# Patient Record
Sex: Female | Born: 1960 | Race: White | Hispanic: No | Marital: Married | Smoking: Former smoker
Health system: Southern US, Community
[De-identification: ages and names within clinical notes are randomized; demographics above are authoritative.]

## PROBLEM LIST (undated history)

## (undated) DIAGNOSIS — G5603 Carpal tunnel syndrome, bilateral upper limbs: Secondary | ICD-10-CM

## (undated) DIAGNOSIS — J309 Allergic rhinitis, unspecified: Secondary | ICD-10-CM

## (undated) DIAGNOSIS — M7541 Impingement syndrome of right shoulder: Secondary | ICD-10-CM

## (undated) DIAGNOSIS — I10 Essential (primary) hypertension: Secondary | ICD-10-CM

## (undated) DIAGNOSIS — L4050 Arthropathic psoriasis, unspecified: Secondary | ICD-10-CM

## (undated) DIAGNOSIS — J329 Chronic sinusitis, unspecified: Secondary | ICD-10-CM

## (undated) DIAGNOSIS — K1120 Sialoadenitis, unspecified: Secondary | ICD-10-CM

## (undated) DIAGNOSIS — E785 Hyperlipidemia, unspecified: Secondary | ICD-10-CM

## (undated) DIAGNOSIS — M4722 Other spondylosis with radiculopathy, cervical region: Secondary | ICD-10-CM

## (undated) DIAGNOSIS — Z8739 Personal history of other diseases of the musculoskeletal system and connective tissue: Secondary | ICD-10-CM

## (undated) DIAGNOSIS — K9041 Non-celiac gluten sensitivity: Secondary | ICD-10-CM

## (undated) DIAGNOSIS — M5412 Radiculopathy, cervical region: Secondary | ICD-10-CM

## (undated) HISTORY — DX: Radiculopathy, cervical region: M54.12

## (undated) HISTORY — DX: Chronic sinusitis, unspecified: J32.9

## (undated) HISTORY — DX: Impingement syndrome of right shoulder: M75.41

## (undated) HISTORY — DX: Allergic rhinitis, unspecified: J30.9

## (undated) HISTORY — DX: Essential (primary) hypertension: I10

## (undated) HISTORY — PX: ABDOMINAL HYSTERECTOMY: SHX81

## (undated) HISTORY — DX: Sialoadenitis, unspecified: K11.20

---
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
## 1995-02-06 HISTORY — PX: JOINT REPLACEMENT: SHX530

## 1995-02-06 HISTORY — PX: TOTAL HIP ARTHROPLASTY: SHX124

## 2014-05-08 ENCOUNTER — Emergency Department (HOSPITAL_COMMUNITY)
Admission: EM | Admit: 2014-05-08 | Discharge: 2014-05-08 | Disposition: A | Discharge status: Home / Self Care | Payer: No Typology Code available for payment source | Source: Home / Self Care | Attending: Family Medicine | Admitting: Family Medicine

## 2014-05-08 ENCOUNTER — Emergency Department (HOSPITAL_COMMUNITY)
Admission: EM | Admit: 2014-05-08 | Discharge: 2014-05-08 | Disposition: A | Discharge status: Home / Self Care | Payer: No Typology Code available for payment source | Attending: Emergency Medicine | Admitting: Emergency Medicine

## 2014-05-08 ENCOUNTER — Encounter (HOSPITAL_COMMUNITY): Payer: Self-pay | Admitting: Emergency Medicine

## 2014-05-08 ENCOUNTER — Encounter (HOSPITAL_COMMUNITY): Payer: Self-pay | Admitting: *Deleted

## 2014-05-08 DIAGNOSIS — L259 Unspecified contact dermatitis, unspecified cause: Secondary | ICD-10-CM | POA: Diagnosis not present

## 2014-05-08 DIAGNOSIS — H6012 Cellulitis of left external ear: Secondary | ICD-10-CM | POA: Insufficient documentation

## 2014-05-08 DIAGNOSIS — T7840XD Allergy, unspecified, subsequent encounter: Secondary | ICD-10-CM

## 2014-05-08 DIAGNOSIS — L239 Allergic contact dermatitis, unspecified cause: Secondary | ICD-10-CM

## 2014-05-08 DIAGNOSIS — L200 Besnier's prurigo: Secondary | ICD-10-CM

## 2014-05-08 DIAGNOSIS — R000 Tachycardia, unspecified: Secondary | ICD-10-CM | POA: Diagnosis not present

## 2014-05-08 DIAGNOSIS — R220 Localized swelling, mass and lump, head: Secondary | ICD-10-CM | POA: Diagnosis present

## 2014-05-08 LAB — CBC WITH DIFFERENTIAL/PLATELET
BASOS PCT: 0 % (ref 0–1)
Basophils Absolute: 0 10*3/uL (ref 0.0–0.1)
EOS ABS: 0.1 10*3/uL (ref 0.0–0.7)
Eosinophils Relative: 1 % (ref 0–5)
HCT: 38 % (ref 36.0–46.0)
Hemoglobin: 12.7 g/dL (ref 12.0–15.0)
LYMPHS ABS: 0.9 10*3/uL (ref 0.7–4.0)
Lymphocytes Relative: 12 % (ref 12–46)
MCH: 31.4 pg (ref 26.0–34.0)
MCHC: 33.4 g/dL (ref 30.0–36.0)
MCV: 94.1 fL (ref 78.0–100.0)
Monocytes Absolute: 1 10*3/uL (ref 0.1–1.0)
Monocytes Relative: 13 % — ABNORMAL HIGH (ref 3–12)
NEUTROS PCT: 74 % (ref 43–77)
Neutro Abs: 5.6 10*3/uL (ref 1.7–7.7)
Platelets: 187 10*3/uL (ref 150–400)
RBC: 4.04 MIL/uL (ref 3.87–5.11)
RDW: 13.2 % (ref 11.5–15.5)
WBC: 7.7 10*3/uL (ref 4.0–10.5)

## 2014-05-08 LAB — BASIC METABOLIC PANEL
Anion gap: 9 (ref 5–15)
BUN: 9 mg/dL (ref 6–23)
CO2: 26 mmol/L (ref 19–32)
CREATININE: 0.83 mg/dL (ref 0.50–1.10)
Calcium: 8.9 mg/dL (ref 8.4–10.5)
Chloride: 99 mmol/L (ref 96–112)
GFR, EST NON AFRICAN AMERICAN: 79 mL/min — AB (ref >90)
Glucose, Bld: 96 mg/dL (ref 70–99)
POTASSIUM: 4 mmol/L (ref 3.5–5.1)
Sodium: 134 mmol/L — ABNORMAL LOW (ref 135–145)

## 2014-05-08 MED ORDER — CLINDAMYCIN HCL 150 MG PO CAPS: 450.0000 mg | ORAL_CAPSULE | Freq: Three times a day (TID) | ORAL | Status: DC

## 2014-05-08 MED ORDER — SODIUM CHLORIDE 0.9 % IV BOLUS (SEPSIS)
1000.0000 mL | Freq: Once | INTRAVENOUS | Status: AC
  Administered 2014-05-08: 1000 mL via INTRAVENOUS

## 2014-05-08 MED ORDER — CEPHALEXIN 500 MG PO CAPS: 500.0000 mg | ORAL_CAPSULE | Freq: Four times a day (QID) | ORAL | Status: DC

## 2014-05-08 MED ORDER — CLINDAMYCIN PHOSPHATE 600 MG/50ML IV SOLN
600.0000 mg | Freq: Once | INTRAVENOUS | Status: AC
  Administered 2014-05-08: 600 mg via INTRAVENOUS
  Filled 2014-05-08: qty 50

## 2014-05-08 MED ORDER — TRIAMCINOLONE ACETONIDE 40 MG/ML IJ SUSP
40.0000 mg | Freq: Once | INTRAMUSCULAR | Status: AC
  Administered 2014-05-08: 40 mg via INTRAMUSCULAR

## 2014-05-08 MED ORDER — TRIAMCINOLONE ACETONIDE 40 MG/ML IJ SUSP
INTRAMUSCULAR | Status: AC
  Filled 2014-05-08: qty 1

## 2014-05-08 MED ORDER — PREDNISONE 20 MG PO TABS: ORAL_TABLET | ORAL | Status: DC

## 2014-05-08 NOTE — ED Provider Notes (Signed)
CSN: [id number]     Arrival date & time 05/08/14  0901 History   First MD Initiated Contact with Patient 05/08/14 [phone]     Chief Complaint  Patient presents with  . Allergic Reaction   (Consider location/radiation/quality/duration/timing/severity/associated sxs/prior Treatment) HPI Comments: 54 year old female complaining of redness and swelling of the face tickly along the hairline in the ears. She had painful lymphadenopathy around the neck and a low-grade fever since she went to [facility] and had strep and mono testing. Both of those were negative.  She continues to have erythema around the fore head, ears as well as a prominent tender right occipital lymph node. She denies problems breathing or swelling but states her head feels really full and heavy.   No past medical history on file. No past surgical history on file. No family history on file. History  Substance Use Topics  . Smoking status: Not on file  . Smokeless tobacco: Not on file  . Alcohol Use: Not on file   OB History    No data available     Review of Systems  Constitutional: Positive for fever and activity change. Negative for fatigue.  HENT: Positive for sore throat. Negative for congestion.   Eyes: Negative.  Eye discharge: mild puffiness particularly on the right. Mild left facial swelling.  Respiratory: Positive for cough. Negative for shortness of breath, wheezing and stridor.   Cardiovascular: Negative for chest pain.  Gastrointestinal: Negative.   Neurological: Negative.   Psychiatric/Behavioral: Negative.     Allergies  Hydromorphone; Morphine and related; Pollen extract; and Pork-derived products  Home Medications   Prior to Admission medications   Medication Sig Start Date End Date Taking? Authorizing Provider  cephALEXin (KEFLEX) 500 MG capsule Take 1 capsule (500 mg total) by mouth 4 (four) times daily. 05/08/14   Hayden Rasmussen, NP  predniSONE (DELTASONE) 20 MG tablet Take 3 tabs po on first day, 2  tabs second day, 2 tabs third day, 1 tab fourth day, 1 tab 5th day. Take with food. 05/08/14   Hayden Rasmussen, NP   BP 130/95 mmHg  Pulse 111  Temp(Src) 99.5 F (37.5 C) (Oral)  Resp 18  SpO2 99% Physical Exam  Constitutional: She is oriented to person, place, and time. She appears well-developed and well-nourished. No distress.  HENT:  Bilateral TMs are normal. External ears with mild erythema and swelling and tenderness. Oropharynx with minor erythema, light frothy PND and cobblestoning.  Neck: Normal range of motion. Neck supple.  Cardiovascular: Regular rhythm and normal heart sounds.   Pulmonary/Chest: Effort normal and breath sounds normal. No respiratory distress. She has no wheezes. She has no rales.  Musculoskeletal:  No peripheral edema involving the extremities.  Lymphadenopathy:    She has cervical adenopathy.  Neurological: She is alert and oriented to person, place, and time.  Skin: Skin is warm and dry.  Nursing note and vitals reviewed.   ED Course  Procedures (including critical care time) Labs Review Labs Reviewed - No data to display  Imaging Review No results found.   MDM   1. Allergic reaction, subsequent encounter   2. Allergic dermatitis    Although I believe that most of this reaction is allergic all treat with a course of Keflex due to her fever and lymphadenopathy. Doubt the erythema is cellulitis but will treat give benefit of the doubt. Kenalog 40 mg IM now Prednisone taper dose Take in addition to your Singulair either Allegra or Zyrtec Worsening new symptoms or  problems may return.    Hayden Rasmussenavid Tanaya Dunigan, NP 05/08/14 [phone]

## 2014-05-08 NOTE — ED Notes (Signed)
Pt states that she believes she has had a allergic reaction to pollen her face is swollen and red pt states that it doesn't itch but does hurt and sensitive to touch.

## 2014-05-08 NOTE — ED Notes (Signed)
Recent hair perm

## 2014-05-08 NOTE — ED Notes (Signed)
Since Monday she has has some nodules from her neck up.  [facility] has been [facility]   This am and was given a shot and other po meds.  She has red splotches from her neck face and scalp.  Both eyes swollen today.  Last Friday she was at the [facility] with all their  Flowers  And her symptoms started on moinday

## 2014-05-08 NOTE — ED Notes (Signed)
She had a [facility] shot shot [facility] this am

## 2014-05-08 NOTE — ED Provider Notes (Signed)
CSN: [id number]     Arrival date & time 05/08/14  1837 History   First MD Initiated Contact with Patient 05/08/14 2029     Chief Complaint  Patient presents with  . Allergic Reaction     Patient is a 54 y.o. female presenting with allergic reaction. The history is provided by the patient. No language interpreter was used.  Allergic Reaction  Ms. Wendy Davies presents for evaluation of facial swelling.  She had a perm 10 days ago.  Seven days ago she developed a slight headache.  By five days ago she had swollen, tender lymph nodes on the back of her neck and posterior scalp.  She had associated malaise.  She is having low grade temperatures (99.8).  [facility] and was tested mono and strep and those were negative.  She developed increased redness around her hairline yesterday with splotches on her face. Today she developed swelling to her left ear and around her eyes.  She was seen at urgent care today and received a steroid shot and was started on prednisone and keflex for allergy and possible skin infection.     History reviewed. No pertinent past medical history. History reviewed. No pertinent past surgical history. No family history on file. History  Substance Use Topics  . Smoking status: Never Smoker   . Smokeless tobacco: Not on file  . Alcohol Use: Yes   OB History    No data available     Review of Systems  All other systems reviewed and are negative.     Allergies  Hydromorphone; Morphine and related; Pollen extract; and Pork-derived products  Home Medications   Prior to Admission medications   Medication Sig Start Date End Date Taking? Authorizing Provider  cephALEXin (KEFLEX) 500 MG capsule Take 1 capsule (500 mg total) by mouth 4 (four) times daily. 05/08/14   Hayden Rasmussenavid Mabe, NP  montelukast (SINGULAIR) 10 MG tablet Take 10 mg by mouth every evening. 04/03/14   Historical Provider, MD  predniSONE (DELTASONE) 20 MG tablet Take 3 tabs po on first day,  2 tabs second day, 2 tabs third day, 1 tab fourth day, 1 tab 5th day. Take with food. 05/08/14   Hayden Rasmussenavid Mabe, NP   BP 122/91 mmHg  Pulse 101  Temp(Src) 98.5 F (36.9 C) (Oral)  Resp 18  SpO2 98% Physical Exam  Constitutional: She is oriented to person, place, and time. She appears well-developed and well-nourished.  HENT:  Head: Normocephalic and atraumatic.  Mouth/Throat: Oropharynx is clear and moist.  Moderate swelling to left helix and pinna with tender preauricular lymphadenopathy.  Patchy erythema and macules to forehead.  Mild periorbital edema bilaterally.    Eyes: Pupils are equal, round, and reactive to light.  Cardiovascular: Regular rhythm.   No murmur heard. tachycardic  Pulmonary/Chest: Effort normal and breath sounds normal. No respiratory distress.  Abdominal: Soft. There is no tenderness. There is no rebound and no guarding.  Musculoskeletal: She exhibits no edema or tenderness.  Lymphadenopathy:    She has cervical adenopathy.  Neurological: She is alert and oriented to person, place, and time. No cranial nerve deficit.  Skin: Skin is warm and dry.  Psychiatric: She has a normal mood and affect. Her behavior is normal.  Nursing note and vitals reviewed.   ED Course  Procedures (including critical care time) Labs Review Labs Reviewed  BASIC METABOLIC PANEL - Abnormal; Notable for the following:    Sodium 134 (*)  GFR calc non Af Amer 79 (*)    All other components within normal limits  CBC WITH DIFFERENTIAL/PLATELET - Abnormal; Notable for the following:    Monocytes Relative 13 (*)    All other components within normal limits    Imaging Review No results found.   EKG Interpretation None      MDM   Final diagnoses:  Cellulitis of ear, left  Contact dermatitis    Patient here for progressive facial swelling and discomfort. Patient is nontoxic on exam and well-hydrated. Exam is consistent with cellulitis of the left ear with some preauricular and  cervical lymphadenopathy. There is no evidence of acute otitis media or mastoiditis. Clinical picture is not consistent with acute chrondritis. Discussed with patient's home care for contact dermatitis as well as cellulitis of the ear. Discussed with patient continuing her prednisone as prescribed at urgent care and changing to clindamycin. Discussed the importance of recheck examination as well as return precautions.    Tilden Fossa, MD 05/08/14 2340

## 2014-05-08 NOTE — Discharge Instructions (Signed)
Cellulitis °Cellulitis is an infection of the skin and the tissue beneath it. The infected area is usually red and tender. Cellulitis occurs most often in the arms and lower legs.  °CAUSES  °Cellulitis is caused by bacteria that enter the skin through cracks or cuts in the skin. The most common types of bacteria that cause cellulitis are staphylococci and streptococci. °SIGNS AND SYMPTOMS  °· Redness and warmth. °· Swelling. °· Tenderness or pain. °· Fever. °DIAGNOSIS  °Your health care provider can usually determine what is wrong based on a physical exam. Blood tests may also be done. °TREATMENT  °Treatment usually involves taking an antibiotic medicine. °HOME CARE INSTRUCTIONS  °· Take your antibiotic medicine as directed by your health care provider. Finish the antibiotic even if you start to feel better. °· Keep the infected arm or leg elevated to reduce swelling. °· Apply a warm cloth to the affected area up to 4 times per day to relieve pain. °· Take medicines only as directed by your health care provider. °· Keep all follow-up visits as directed by your health care provider. °SEEK MEDICAL CARE IF:  °· You notice red streaks coming from the infected area. °· Your red area gets larger or turns dark in color. °· Your bone or joint underneath the infected area becomes painful after the skin has healed. °· Your infection returns in the same area or another area. °· You notice a swollen bump in the infected area. °· You develop new symptoms. °· You have a fever. °SEEK IMMEDIATE MEDICAL CARE IF:  °· You feel very sleepy. °· You develop vomiting or diarrhea. °· You have a general ill feeling (malaise) with muscle aches and pains. °MAKE SURE YOU:  °· Understand these instructions. °· Will watch your condition. °· Will get help right away if you are not doing well or get worse. °Document Released: 11/01/2004 Document Revised: 06/08/2013 Document Reviewed: 04/09/2011 °ExitCare® Patient Information ©2015 [employer].  This information is not intended to replace advice given to you by your health care provider. Make sure you discuss any questions you have with your health care provider. ° °Contact Dermatitis °Contact dermatitis is a reaction to certain substances that touch the skin. Contact dermatitis can be either irritant contact dermatitis or allergic contact dermatitis. Irritant contact dermatitis does not require previous exposure to the substance for a reaction to occur. Allergic contact dermatitis only occurs if you have been exposed to the substance before. Upon a repeat exposure, your body reacts to the substance.  °CAUSES  °Many substances can cause contact dermatitis. Irritant dermatitis is most commonly caused by repeated exposure to mildly irritating substances, such as: °· Makeup. °· Soaps. °· Detergents. °· Bleaches. °· Acids. °· Metal salts, such as nickel. °Allergic contact dermatitis is most commonly caused by exposure to: °· Poisonous plants. °· Chemicals (deodorants, shampoos). °· Jewelry. °· Latex. °· Neomycin in triple antibiotic cream. °· Preservatives in products, including clothing. °SYMPTOMS  °The area of skin that is exposed may develop: °· Dryness or flaking. °· Redness. °· Cracks. °· Itching. °· Pain or a burning sensation. °· Blisters. °With allergic contact dermatitis, there may also be swelling in areas such as the eyelids, mouth, or genitals.  °DIAGNOSIS  °Your caregiver can usually tell what the problem is by doing a physical exam. In cases where the cause is uncertain and an allergic contact dermatitis is suspected, a patch skin test may be performed to help determine the cause of your dermatitis. °TREATMENT °Treatment includes   protecting the skin from further contact with the irritating substance by avoiding that substance if possible. Barrier creams, powders, and gloves may be helpful. Your caregiver may also recommend: °· Steroid creams or ointments applied 2 times daily. For best results,  soak the rash area in cool water for 20 minutes. Then apply the medicine. Cover the area with a plastic wrap. You can store the steroid cream in the refrigerator for a "chilly" effect on your rash. That may decrease itching. Oral steroid medicines may be needed in more severe cases. °· Antibiotics or antibacterial ointments if a skin infection is present. °· Antihistamine lotion or an antihistamine taken by mouth to ease itching. °· Lubricants to keep moisture in your skin. °· Burow's solution to reduce redness and soreness or to dry a weeping rash. Mix one packet or tablet of solution in 2 cups cool water. Dip a clean washcloth in the mixture, wring it out a bit, and put it on the affected area. Leave the cloth in place for 30 minutes. Do this as often as possible throughout the day. °· Taking several cornstarch or baking soda baths daily if the area is too large to cover with a washcloth. °Harsh chemicals, such as alkalis or acids, can cause skin damage that is like a burn. You should flush your skin for 15 to 20 minutes with cold water after such an exposure. You should also seek immediate medical care after exposure. Bandages (dressings), antibiotics, and pain medicine may be needed for severely irritated skin.  °HOME CARE INSTRUCTIONS °· Avoid the substance that caused your reaction. °· Keep the area of skin that is affected away from hot water, soap, sunlight, chemicals, acidic substances, or anything else that would irritate your skin. °· Do not scratch the rash. Scratching may cause the rash to become infected. °· You may take cool baths to help stop the itching. °· Only take over-the-counter or prescription medicines as directed by your caregiver. °· See your caregiver for follow-up care as directed to make sure your skin is healing properly. °SEEK MEDICAL CARE IF:  °· Your condition is not better after 3 days of treatment. °· You seem to be getting worse. °· You see signs of infection such as swelling,  tenderness, redness, soreness, or warmth in the affected area. °· You have any problems related to your medicines. °Document Released: 01/20/2000 Document Revised: 04/16/2011 Document Reviewed: 06/27/2010 °ExitCare® Patient Information ©2015 [employer]. This information is not intended to replace advice given to you by your health care provider. Make sure you discuss any questions you have with your health care provider. ° °

## 2014-05-08 NOTE — Discharge Instructions (Signed)

## 2014-05-11 ENCOUNTER — Telehealth (HOSPITAL_COMMUNITY): Payer: Self-pay | Admitting: *Deleted

## 2014-05-11 NOTE — ED Notes (Signed)
Pt. called on VM on 4/2 and said she was seen here this AM and had steroid shot and Rx. of antibiotics.  She said her eyes are almost swollen shut.  I called pt. Back. She said she went to the ED and they treated her.  She is better now. Wendy MoselleYork, Starlet Gallentine M 05/11/2014

## 2015-05-16 DIAGNOSIS — M542 Cervicalgia: Secondary | ICD-10-CM | POA: Diagnosis not present

## 2015-05-16 DIAGNOSIS — M9901 Segmental and somatic dysfunction of cervical region: Secondary | ICD-10-CM | POA: Diagnosis not present

## 2015-05-16 DIAGNOSIS — M9902 Segmental and somatic dysfunction of thoracic region: Secondary | ICD-10-CM | POA: Diagnosis not present

## 2015-05-16 DIAGNOSIS — M47812 Spondylosis without myelopathy or radiculopathy, cervical region: Secondary | ICD-10-CM | POA: Diagnosis not present

## 2015-05-23 DIAGNOSIS — M62838 Other muscle spasm: Secondary | ICD-10-CM | POA: Diagnosis not present

## 2015-05-23 DIAGNOSIS — N941 Unspecified dyspareunia: Secondary | ICD-10-CM | POA: Diagnosis not present

## 2015-05-23 DIAGNOSIS — R278 Other lack of coordination: Secondary | ICD-10-CM | POA: Diagnosis not present

## 2015-05-23 DIAGNOSIS — M6281 Muscle weakness (generalized): Secondary | ICD-10-CM | POA: Diagnosis not present

## 2015-06-06 DIAGNOSIS — N941 Unspecified dyspareunia: Secondary | ICD-10-CM | POA: Diagnosis not present

## 2015-06-06 DIAGNOSIS — M6281 Muscle weakness (generalized): Secondary | ICD-10-CM | POA: Diagnosis not present

## 2015-06-06 DIAGNOSIS — M62838 Other muscle spasm: Secondary | ICD-10-CM | POA: Diagnosis not present

## 2015-06-06 DIAGNOSIS — R278 Other lack of coordination: Secondary | ICD-10-CM | POA: Diagnosis not present

## 2015-06-13 DIAGNOSIS — M47812 Spondylosis without myelopathy or radiculopathy, cervical region: Secondary | ICD-10-CM | POA: Diagnosis not present

## 2015-06-13 DIAGNOSIS — M9902 Segmental and somatic dysfunction of thoracic region: Secondary | ICD-10-CM | POA: Diagnosis not present

## 2015-06-13 DIAGNOSIS — M9901 Segmental and somatic dysfunction of cervical region: Secondary | ICD-10-CM | POA: Diagnosis not present

## 2015-06-13 DIAGNOSIS — M542 Cervicalgia: Secondary | ICD-10-CM | POA: Diagnosis not present

## 2015-06-29 DIAGNOSIS — M9902 Segmental and somatic dysfunction of thoracic region: Secondary | ICD-10-CM | POA: Diagnosis not present

## 2015-06-29 DIAGNOSIS — M47812 Spondylosis without myelopathy or radiculopathy, cervical region: Secondary | ICD-10-CM | POA: Diagnosis not present

## 2015-06-29 DIAGNOSIS — M9901 Segmental and somatic dysfunction of cervical region: Secondary | ICD-10-CM | POA: Diagnosis not present

## 2015-07-01 DIAGNOSIS — M6281 Muscle weakness (generalized): Secondary | ICD-10-CM | POA: Diagnosis not present

## 2015-07-01 DIAGNOSIS — M62838 Other muscle spasm: Secondary | ICD-10-CM | POA: Diagnosis not present

## 2015-07-01 DIAGNOSIS — R278 Other lack of coordination: Secondary | ICD-10-CM | POA: Diagnosis not present

## 2015-07-01 DIAGNOSIS — N941 Unspecified dyspareunia: Secondary | ICD-10-CM | POA: Diagnosis not present

## 2015-07-11 DIAGNOSIS — M47812 Spondylosis without myelopathy or radiculopathy, cervical region: Secondary | ICD-10-CM | POA: Diagnosis not present

## 2015-07-11 DIAGNOSIS — M9901 Segmental and somatic dysfunction of cervical region: Secondary | ICD-10-CM | POA: Diagnosis not present

## 2015-07-11 DIAGNOSIS — M542 Cervicalgia: Secondary | ICD-10-CM | POA: Diagnosis not present

## 2015-07-25 DIAGNOSIS — M542 Cervicalgia: Secondary | ICD-10-CM | POA: Diagnosis not present

## 2015-07-25 DIAGNOSIS — M9901 Segmental and somatic dysfunction of cervical region: Secondary | ICD-10-CM | POA: Diagnosis not present

## 2015-07-25 DIAGNOSIS — M47812 Spondylosis without myelopathy or radiculopathy, cervical region: Secondary | ICD-10-CM | POA: Diagnosis not present

## 2015-08-15 DIAGNOSIS — M47812 Spondylosis without myelopathy or radiculopathy, cervical region: Secondary | ICD-10-CM | POA: Diagnosis not present

## 2015-08-15 DIAGNOSIS — M9902 Segmental and somatic dysfunction of thoracic region: Secondary | ICD-10-CM | POA: Diagnosis not present

## 2015-08-15 DIAGNOSIS — M9901 Segmental and somatic dysfunction of cervical region: Secondary | ICD-10-CM | POA: Diagnosis not present

## 2015-08-15 DIAGNOSIS — M542 Cervicalgia: Secondary | ICD-10-CM | POA: Diagnosis not present

## 2015-08-17 DIAGNOSIS — R278 Other lack of coordination: Secondary | ICD-10-CM | POA: Diagnosis not present

## 2015-08-17 DIAGNOSIS — M62838 Other muscle spasm: Secondary | ICD-10-CM | POA: Diagnosis not present

## 2015-08-17 DIAGNOSIS — M6281 Muscle weakness (generalized): Secondary | ICD-10-CM | POA: Diagnosis not present

## 2015-08-17 DIAGNOSIS — L905 Scar conditions and fibrosis of skin: Secondary | ICD-10-CM | POA: Diagnosis not present

## 2015-08-22 DIAGNOSIS — Z136 Encounter for screening for cardiovascular disorders: Secondary | ICD-10-CM | POA: Diagnosis not present

## 2015-08-22 DIAGNOSIS — Z0000 Encounter for general adult medical examination without abnormal findings: Secondary | ICD-10-CM | POA: Diagnosis not present

## 2015-08-22 LAB — LIPID PANEL
CHOLESTEROL: 239 — AB (ref 0–200)
HDL: 90 — AB (ref 35–70)
Triglycerides: 56 (ref 40–160)

## 2015-08-22 LAB — BASIC METABOLIC PANEL
BUN: 16 (ref 4–21)
CREATININE: 0.9 (ref 0.5–1.1)
Glucose: 75
SODIUM: 140 (ref 137–147)

## 2015-08-22 LAB — CBC AND DIFFERENTIAL: WBC: 3.9

## 2015-08-23 LAB — TSH: TSH: 2.38 (ref <=5.90)

## 2015-08-23 LAB — HM HIV SCREENING LAB: HM HIV SCREENING: NEGATIVE

## 2015-08-24 ENCOUNTER — Other Ambulatory Visit: Payer: Self-pay | Admitting: Family Medicine

## 2015-08-24 DIAGNOSIS — Z6827 Body mass index (BMI) 27.0-27.9, adult: Secondary | ICD-10-CM | POA: Diagnosis not present

## 2015-08-24 DIAGNOSIS — Z0000 Encounter for general adult medical examination without abnormal findings: Secondary | ICD-10-CM | POA: Diagnosis not present

## 2015-08-24 DIAGNOSIS — Z1231 Encounter for screening mammogram for malignant neoplasm of breast: Secondary | ICD-10-CM

## 2015-08-24 DIAGNOSIS — Z1211 Encounter for screening for malignant neoplasm of colon: Secondary | ICD-10-CM | POA: Diagnosis not present

## 2015-08-29 DIAGNOSIS — M47812 Spondylosis without myelopathy or radiculopathy, cervical region: Secondary | ICD-10-CM | POA: Diagnosis not present

## 2015-08-29 DIAGNOSIS — M542 Cervicalgia: Secondary | ICD-10-CM | POA: Diagnosis not present

## 2015-08-29 DIAGNOSIS — M9901 Segmental and somatic dysfunction of cervical region: Secondary | ICD-10-CM | POA: Diagnosis not present

## 2015-09-02 ENCOUNTER — Ambulatory Visit
Admission: RE | Admit: 2015-09-02 | Discharge: 2015-09-02 | Disposition: A | Discharge status: Home / Self Care | Payer: No Typology Code available for payment source | Source: Ambulatory Visit | Attending: Family Medicine | Admitting: Family Medicine

## 2015-09-02 DIAGNOSIS — Z1231 Encounter for screening mammogram for malignant neoplasm of breast: Secondary | ICD-10-CM | POA: Diagnosis not present

## 2015-09-19 DIAGNOSIS — M9902 Segmental and somatic dysfunction of thoracic region: Secondary | ICD-10-CM | POA: Diagnosis not present

## 2015-09-19 DIAGNOSIS — M47812 Spondylosis without myelopathy or radiculopathy, cervical region: Secondary | ICD-10-CM | POA: Diagnosis not present

## 2015-09-19 DIAGNOSIS — M542 Cervicalgia: Secondary | ICD-10-CM | POA: Diagnosis not present

## 2015-09-19 DIAGNOSIS — M9901 Segmental and somatic dysfunction of cervical region: Secondary | ICD-10-CM | POA: Diagnosis not present

## 2015-09-22 DIAGNOSIS — Z1211 Encounter for screening for malignant neoplasm of colon: Secondary | ICD-10-CM | POA: Diagnosis not present

## 2015-09-22 LAB — HM COLONOSCOPY

## 2015-10-03 DIAGNOSIS — M542 Cervicalgia: Secondary | ICD-10-CM | POA: Diagnosis not present

## 2015-10-03 DIAGNOSIS — M47812 Spondylosis without myelopathy or radiculopathy, cervical region: Secondary | ICD-10-CM | POA: Diagnosis not present

## 2015-10-03 DIAGNOSIS — M9902 Segmental and somatic dysfunction of thoracic region: Secondary | ICD-10-CM | POA: Diagnosis not present

## 2015-10-03 DIAGNOSIS — M9901 Segmental and somatic dysfunction of cervical region: Secondary | ICD-10-CM | POA: Diagnosis not present

## 2015-10-17 DIAGNOSIS — M542 Cervicalgia: Secondary | ICD-10-CM | POA: Diagnosis not present

## 2015-10-17 DIAGNOSIS — M9901 Segmental and somatic dysfunction of cervical region: Secondary | ICD-10-CM | POA: Diagnosis not present

## 2015-10-17 DIAGNOSIS — M47812 Spondylosis without myelopathy or radiculopathy, cervical region: Secondary | ICD-10-CM | POA: Diagnosis not present

## 2015-10-31 DIAGNOSIS — M9901 Segmental and somatic dysfunction of cervical region: Secondary | ICD-10-CM | POA: Diagnosis not present

## 2015-10-31 DIAGNOSIS — M542 Cervicalgia: Secondary | ICD-10-CM | POA: Diagnosis not present

## 2015-10-31 DIAGNOSIS — M47812 Spondylosis without myelopathy or radiculopathy, cervical region: Secondary | ICD-10-CM | POA: Diagnosis not present

## 2015-10-31 DIAGNOSIS — M9902 Segmental and somatic dysfunction of thoracic region: Secondary | ICD-10-CM | POA: Diagnosis not present

## 2015-12-12 DIAGNOSIS — D2312 Other benign neoplasm of skin of left eyelid, including canthus: Secondary | ICD-10-CM | POA: Diagnosis not present

## 2015-12-12 DIAGNOSIS — H25013 Cortical age-related cataract, bilateral: Secondary | ICD-10-CM | POA: Diagnosis not present

## 2015-12-12 DIAGNOSIS — H1851 Endothelial corneal dystrophy: Secondary | ICD-10-CM | POA: Diagnosis not present

## 2015-12-12 DIAGNOSIS — H2513 Age-related nuclear cataract, bilateral: Secondary | ICD-10-CM | POA: Diagnosis not present

## 2016-02-13 DIAGNOSIS — G441 Vascular headache, not elsewhere classified: Secondary | ICD-10-CM | POA: Diagnosis not present

## 2016-02-13 DIAGNOSIS — M9902 Segmental and somatic dysfunction of thoracic region: Secondary | ICD-10-CM | POA: Diagnosis not present

## 2016-02-13 DIAGNOSIS — M9901 Segmental and somatic dysfunction of cervical region: Secondary | ICD-10-CM | POA: Diagnosis not present

## 2016-02-13 DIAGNOSIS — M47812 Spondylosis without myelopathy or radiculopathy, cervical region: Secondary | ICD-10-CM | POA: Diagnosis not present

## 2016-02-21 DIAGNOSIS — G441 Vascular headache, not elsewhere classified: Secondary | ICD-10-CM | POA: Diagnosis not present

## 2016-02-21 DIAGNOSIS — M9901 Segmental and somatic dysfunction of cervical region: Secondary | ICD-10-CM | POA: Diagnosis not present

## 2016-02-21 DIAGNOSIS — M47812 Spondylosis without myelopathy or radiculopathy, cervical region: Secondary | ICD-10-CM | POA: Diagnosis not present

## 2016-02-21 DIAGNOSIS — M9902 Segmental and somatic dysfunction of thoracic region: Secondary | ICD-10-CM | POA: Diagnosis not present

## 2016-03-01 DIAGNOSIS — M9902 Segmental and somatic dysfunction of thoracic region: Secondary | ICD-10-CM | POA: Diagnosis not present

## 2016-03-01 DIAGNOSIS — G441 Vascular headache, not elsewhere classified: Secondary | ICD-10-CM | POA: Diagnosis not present

## 2016-03-01 DIAGNOSIS — M47812 Spondylosis without myelopathy or radiculopathy, cervical region: Secondary | ICD-10-CM | POA: Diagnosis not present

## 2016-03-01 DIAGNOSIS — M9901 Segmental and somatic dysfunction of cervical region: Secondary | ICD-10-CM | POA: Diagnosis not present

## 2016-03-05 DIAGNOSIS — G441 Vascular headache, not elsewhere classified: Secondary | ICD-10-CM | POA: Diagnosis not present

## 2016-03-05 DIAGNOSIS — M47812 Spondylosis without myelopathy or radiculopathy, cervical region: Secondary | ICD-10-CM | POA: Diagnosis not present

## 2016-03-05 DIAGNOSIS — M9901 Segmental and somatic dysfunction of cervical region: Secondary | ICD-10-CM | POA: Diagnosis not present

## 2016-03-05 DIAGNOSIS — M9902 Segmental and somatic dysfunction of thoracic region: Secondary | ICD-10-CM | POA: Diagnosis not present

## 2016-03-07 DIAGNOSIS — R030 Elevated blood-pressure reading, without diagnosis of hypertension: Secondary | ICD-10-CM | POA: Diagnosis not present

## 2016-03-07 DIAGNOSIS — J329 Chronic sinusitis, unspecified: Secondary | ICD-10-CM | POA: Diagnosis not present

## 2016-03-12 DIAGNOSIS — G441 Vascular headache, not elsewhere classified: Secondary | ICD-10-CM | POA: Diagnosis not present

## 2016-03-12 DIAGNOSIS — M47812 Spondylosis without myelopathy or radiculopathy, cervical region: Secondary | ICD-10-CM | POA: Diagnosis not present

## 2016-03-12 DIAGNOSIS — M9901 Segmental and somatic dysfunction of cervical region: Secondary | ICD-10-CM | POA: Diagnosis not present

## 2016-03-12 DIAGNOSIS — M9902 Segmental and somatic dysfunction of thoracic region: Secondary | ICD-10-CM | POA: Diagnosis not present

## 2016-03-19 DIAGNOSIS — G441 Vascular headache, not elsewhere classified: Secondary | ICD-10-CM | POA: Diagnosis not present

## 2016-03-19 DIAGNOSIS — M47812 Spondylosis without myelopathy or radiculopathy, cervical region: Secondary | ICD-10-CM | POA: Diagnosis not present

## 2016-03-19 DIAGNOSIS — M9902 Segmental and somatic dysfunction of thoracic region: Secondary | ICD-10-CM | POA: Diagnosis not present

## 2016-03-19 DIAGNOSIS — M9901 Segmental and somatic dysfunction of cervical region: Secondary | ICD-10-CM | POA: Diagnosis not present

## 2016-03-26 DIAGNOSIS — M47812 Spondylosis without myelopathy or radiculopathy, cervical region: Secondary | ICD-10-CM | POA: Diagnosis not present

## 2016-03-26 DIAGNOSIS — M9901 Segmental and somatic dysfunction of cervical region: Secondary | ICD-10-CM | POA: Diagnosis not present

## 2016-03-26 DIAGNOSIS — G441 Vascular headache, not elsewhere classified: Secondary | ICD-10-CM | POA: Diagnosis not present

## 2016-03-26 DIAGNOSIS — M9902 Segmental and somatic dysfunction of thoracic region: Secondary | ICD-10-CM | POA: Diagnosis not present

## 2016-03-27 DIAGNOSIS — J309 Allergic rhinitis, unspecified: Secondary | ICD-10-CM | POA: Diagnosis not present

## 2016-03-27 DIAGNOSIS — M5412 Radiculopathy, cervical region: Secondary | ICD-10-CM

## 2016-03-27 DIAGNOSIS — H6121 Impacted cerumen, right ear: Secondary | ICD-10-CM | POA: Diagnosis not present

## 2016-03-27 DIAGNOSIS — M7541 Impingement syndrome of right shoulder: Secondary | ICD-10-CM | POA: Diagnosis not present

## 2016-03-27 DIAGNOSIS — R030 Elevated blood-pressure reading, without diagnosis of hypertension: Secondary | ICD-10-CM | POA: Diagnosis not present

## 2016-03-27 HISTORY — DX: Radiculopathy, cervical region: M54.12

## 2016-04-02 DIAGNOSIS — M47812 Spondylosis without myelopathy or radiculopathy, cervical region: Secondary | ICD-10-CM | POA: Diagnosis not present

## 2016-04-02 DIAGNOSIS — G441 Vascular headache, not elsewhere classified: Secondary | ICD-10-CM | POA: Diagnosis not present

## 2016-04-02 DIAGNOSIS — M9901 Segmental and somatic dysfunction of cervical region: Secondary | ICD-10-CM | POA: Diagnosis not present

## 2016-04-02 DIAGNOSIS — M9902 Segmental and somatic dysfunction of thoracic region: Secondary | ICD-10-CM | POA: Diagnosis not present

## 2016-04-09 DIAGNOSIS — G441 Vascular headache, not elsewhere classified: Secondary | ICD-10-CM | POA: Diagnosis not present

## 2016-04-09 DIAGNOSIS — M9902 Segmental and somatic dysfunction of thoracic region: Secondary | ICD-10-CM | POA: Diagnosis not present

## 2016-04-09 DIAGNOSIS — M47812 Spondylosis without myelopathy or radiculopathy, cervical region: Secondary | ICD-10-CM | POA: Diagnosis not present

## 2016-04-09 DIAGNOSIS — M9901 Segmental and somatic dysfunction of cervical region: Secondary | ICD-10-CM | POA: Diagnosis not present

## 2016-04-16 DIAGNOSIS — M9902 Segmental and somatic dysfunction of thoracic region: Secondary | ICD-10-CM | POA: Diagnosis not present

## 2016-04-16 DIAGNOSIS — M47812 Spondylosis without myelopathy or radiculopathy, cervical region: Secondary | ICD-10-CM | POA: Diagnosis not present

## 2016-04-16 DIAGNOSIS — G441 Vascular headache, not elsewhere classified: Secondary | ICD-10-CM | POA: Diagnosis not present

## 2016-04-16 DIAGNOSIS — M9901 Segmental and somatic dysfunction of cervical region: Secondary | ICD-10-CM | POA: Diagnosis not present

## 2016-04-23 DIAGNOSIS — M9902 Segmental and somatic dysfunction of thoracic region: Secondary | ICD-10-CM | POA: Diagnosis not present

## 2016-04-23 DIAGNOSIS — G441 Vascular headache, not elsewhere classified: Secondary | ICD-10-CM | POA: Diagnosis not present

## 2016-04-23 DIAGNOSIS — M47812 Spondylosis without myelopathy or radiculopathy, cervical region: Secondary | ICD-10-CM | POA: Diagnosis not present

## 2016-04-23 DIAGNOSIS — M9901 Segmental and somatic dysfunction of cervical region: Secondary | ICD-10-CM | POA: Diagnosis not present

## 2016-04-30 DIAGNOSIS — M9902 Segmental and somatic dysfunction of thoracic region: Secondary | ICD-10-CM | POA: Diagnosis not present

## 2016-04-30 DIAGNOSIS — G441 Vascular headache, not elsewhere classified: Secondary | ICD-10-CM | POA: Diagnosis not present

## 2016-04-30 DIAGNOSIS — M47812 Spondylosis without myelopathy or radiculopathy, cervical region: Secondary | ICD-10-CM | POA: Diagnosis not present

## 2016-04-30 DIAGNOSIS — M9901 Segmental and somatic dysfunction of cervical region: Secondary | ICD-10-CM | POA: Diagnosis not present

## 2016-05-07 DIAGNOSIS — M9902 Segmental and somatic dysfunction of thoracic region: Secondary | ICD-10-CM | POA: Diagnosis not present

## 2016-05-07 DIAGNOSIS — M47812 Spondylosis without myelopathy or radiculopathy, cervical region: Secondary | ICD-10-CM | POA: Diagnosis not present

## 2016-05-07 DIAGNOSIS — M9901 Segmental and somatic dysfunction of cervical region: Secondary | ICD-10-CM | POA: Diagnosis not present

## 2016-05-07 DIAGNOSIS — G441 Vascular headache, not elsewhere classified: Secondary | ICD-10-CM | POA: Diagnosis not present

## 2016-05-14 DIAGNOSIS — G441 Vascular headache, not elsewhere classified: Secondary | ICD-10-CM | POA: Diagnosis not present

## 2016-05-14 DIAGNOSIS — M9902 Segmental and somatic dysfunction of thoracic region: Secondary | ICD-10-CM | POA: Diagnosis not present

## 2016-05-14 DIAGNOSIS — M47812 Spondylosis without myelopathy or radiculopathy, cervical region: Secondary | ICD-10-CM | POA: Diagnosis not present

## 2016-05-14 DIAGNOSIS — M9901 Segmental and somatic dysfunction of cervical region: Secondary | ICD-10-CM | POA: Diagnosis not present

## 2016-05-15 DIAGNOSIS — M5412 Radiculopathy, cervical region: Secondary | ICD-10-CM | POA: Diagnosis not present

## 2016-05-15 DIAGNOSIS — J309 Allergic rhinitis, unspecified: Secondary | ICD-10-CM | POA: Diagnosis not present

## 2016-05-15 DIAGNOSIS — M7541 Impingement syndrome of right shoulder: Secondary | ICD-10-CM | POA: Diagnosis not present

## 2016-05-15 DIAGNOSIS — Z6826 Body mass index (BMI) 26.0-26.9, adult: Secondary | ICD-10-CM | POA: Diagnosis not present

## 2016-05-21 DIAGNOSIS — M9901 Segmental and somatic dysfunction of cervical region: Secondary | ICD-10-CM | POA: Diagnosis not present

## 2016-05-21 DIAGNOSIS — M9902 Segmental and somatic dysfunction of thoracic region: Secondary | ICD-10-CM | POA: Diagnosis not present

## 2016-05-21 DIAGNOSIS — M47812 Spondylosis without myelopathy or radiculopathy, cervical region: Secondary | ICD-10-CM | POA: Diagnosis not present

## 2016-05-21 DIAGNOSIS — G441 Vascular headache, not elsewhere classified: Secondary | ICD-10-CM | POA: Diagnosis not present

## 2016-05-23 DIAGNOSIS — M25511 Pain in right shoulder: Secondary | ICD-10-CM | POA: Diagnosis not present

## 2016-05-23 DIAGNOSIS — M7541 Impingement syndrome of right shoulder: Secondary | ICD-10-CM | POA: Diagnosis not present

## 2016-05-23 DIAGNOSIS — M25611 Stiffness of right shoulder, not elsewhere classified: Secondary | ICD-10-CM | POA: Diagnosis not present

## 2016-05-23 DIAGNOSIS — M75111 Incomplete rotator cuff tear or rupture of right shoulder, not specified as traumatic: Secondary | ICD-10-CM | POA: Diagnosis not present

## 2016-05-30 DIAGNOSIS — M75111 Incomplete rotator cuff tear or rupture of right shoulder, not specified as traumatic: Secondary | ICD-10-CM | POA: Diagnosis not present

## 2016-05-30 DIAGNOSIS — M25611 Stiffness of right shoulder, not elsewhere classified: Secondary | ICD-10-CM | POA: Diagnosis not present

## 2016-05-30 DIAGNOSIS — M25511 Pain in right shoulder: Secondary | ICD-10-CM | POA: Diagnosis not present

## 2016-06-04 DIAGNOSIS — M9901 Segmental and somatic dysfunction of cervical region: Secondary | ICD-10-CM | POA: Diagnosis not present

## 2016-06-04 DIAGNOSIS — M9902 Segmental and somatic dysfunction of thoracic region: Secondary | ICD-10-CM | POA: Diagnosis not present

## 2016-06-04 DIAGNOSIS — M47812 Spondylosis without myelopathy or radiculopathy, cervical region: Secondary | ICD-10-CM | POA: Diagnosis not present

## 2016-06-04 DIAGNOSIS — G441 Vascular headache, not elsewhere classified: Secondary | ICD-10-CM | POA: Diagnosis not present

## 2016-06-08 DIAGNOSIS — M25511 Pain in right shoulder: Secondary | ICD-10-CM | POA: Diagnosis not present

## 2016-06-08 DIAGNOSIS — M75111 Incomplete rotator cuff tear or rupture of right shoulder, not specified as traumatic: Secondary | ICD-10-CM | POA: Diagnosis not present

## 2016-06-08 DIAGNOSIS — M25611 Stiffness of right shoulder, not elsewhere classified: Secondary | ICD-10-CM | POA: Diagnosis not present

## 2016-06-08 DIAGNOSIS — M7541 Impingement syndrome of right shoulder: Secondary | ICD-10-CM | POA: Diagnosis not present

## 2016-06-11 DIAGNOSIS — G441 Vascular headache, not elsewhere classified: Secondary | ICD-10-CM | POA: Diagnosis not present

## 2016-06-11 DIAGNOSIS — M9902 Segmental and somatic dysfunction of thoracic region: Secondary | ICD-10-CM | POA: Diagnosis not present

## 2016-06-11 DIAGNOSIS — M47812 Spondylosis without myelopathy or radiculopathy, cervical region: Secondary | ICD-10-CM | POA: Diagnosis not present

## 2016-06-11 DIAGNOSIS — M9901 Segmental and somatic dysfunction of cervical region: Secondary | ICD-10-CM | POA: Diagnosis not present

## 2016-06-13 DIAGNOSIS — M75111 Incomplete rotator cuff tear or rupture of right shoulder, not specified as traumatic: Secondary | ICD-10-CM | POA: Diagnosis not present

## 2016-06-13 DIAGNOSIS — M25611 Stiffness of right shoulder, not elsewhere classified: Secondary | ICD-10-CM | POA: Diagnosis not present

## 2016-06-13 DIAGNOSIS — M25511 Pain in right shoulder: Secondary | ICD-10-CM | POA: Diagnosis not present

## 2016-06-13 DIAGNOSIS — M7541 Impingement syndrome of right shoulder: Secondary | ICD-10-CM | POA: Diagnosis not present

## 2016-06-18 DIAGNOSIS — M47812 Spondylosis without myelopathy or radiculopathy, cervical region: Secondary | ICD-10-CM | POA: Diagnosis not present

## 2016-06-18 DIAGNOSIS — G441 Vascular headache, not elsewhere classified: Secondary | ICD-10-CM | POA: Diagnosis not present

## 2016-06-18 DIAGNOSIS — M9901 Segmental and somatic dysfunction of cervical region: Secondary | ICD-10-CM | POA: Diagnosis not present

## 2016-06-18 DIAGNOSIS — M9902 Segmental and somatic dysfunction of thoracic region: Secondary | ICD-10-CM | POA: Diagnosis not present

## 2016-06-22 DIAGNOSIS — M25611 Stiffness of right shoulder, not elsewhere classified: Secondary | ICD-10-CM | POA: Diagnosis not present

## 2016-06-22 DIAGNOSIS — M7541 Impingement syndrome of right shoulder: Secondary | ICD-10-CM | POA: Diagnosis not present

## 2016-06-22 DIAGNOSIS — M75111 Incomplete rotator cuff tear or rupture of right shoulder, not specified as traumatic: Secondary | ICD-10-CM | POA: Diagnosis not present

## 2016-06-22 DIAGNOSIS — M25511 Pain in right shoulder: Secondary | ICD-10-CM | POA: Diagnosis not present

## 2016-06-25 DIAGNOSIS — M9902 Segmental and somatic dysfunction of thoracic region: Secondary | ICD-10-CM | POA: Diagnosis not present

## 2016-06-25 DIAGNOSIS — M47812 Spondylosis without myelopathy or radiculopathy, cervical region: Secondary | ICD-10-CM | POA: Diagnosis not present

## 2016-06-25 DIAGNOSIS — G441 Vascular headache, not elsewhere classified: Secondary | ICD-10-CM | POA: Diagnosis not present

## 2016-06-25 DIAGNOSIS — M9901 Segmental and somatic dysfunction of cervical region: Secondary | ICD-10-CM | POA: Diagnosis not present

## 2016-06-27 DIAGNOSIS — M25611 Stiffness of right shoulder, not elsewhere classified: Secondary | ICD-10-CM | POA: Diagnosis not present

## 2016-06-27 DIAGNOSIS — M75111 Incomplete rotator cuff tear or rupture of right shoulder, not specified as traumatic: Secondary | ICD-10-CM | POA: Diagnosis not present

## 2016-06-27 DIAGNOSIS — M25511 Pain in right shoulder: Secondary | ICD-10-CM | POA: Diagnosis not present

## 2016-06-27 DIAGNOSIS — M7541 Impingement syndrome of right shoulder: Secondary | ICD-10-CM | POA: Diagnosis not present

## 2016-07-03 DIAGNOSIS — M9902 Segmental and somatic dysfunction of thoracic region: Secondary | ICD-10-CM | POA: Diagnosis not present

## 2016-07-03 DIAGNOSIS — M9901 Segmental and somatic dysfunction of cervical region: Secondary | ICD-10-CM | POA: Diagnosis not present

## 2016-07-03 DIAGNOSIS — M47812 Spondylosis without myelopathy or radiculopathy, cervical region: Secondary | ICD-10-CM | POA: Diagnosis not present

## 2016-07-03 DIAGNOSIS — G441 Vascular headache, not elsewhere classified: Secondary | ICD-10-CM | POA: Diagnosis not present

## 2016-07-17 DIAGNOSIS — M9902 Segmental and somatic dysfunction of thoracic region: Secondary | ICD-10-CM | POA: Diagnosis not present

## 2016-07-17 DIAGNOSIS — M9901 Segmental and somatic dysfunction of cervical region: Secondary | ICD-10-CM | POA: Diagnosis not present

## 2016-07-17 DIAGNOSIS — M47812 Spondylosis without myelopathy or radiculopathy, cervical region: Secondary | ICD-10-CM | POA: Diagnosis not present

## 2016-07-17 DIAGNOSIS — G441 Vascular headache, not elsewhere classified: Secondary | ICD-10-CM | POA: Diagnosis not present

## 2016-07-23 DIAGNOSIS — M9901 Segmental and somatic dysfunction of cervical region: Secondary | ICD-10-CM | POA: Diagnosis not present

## 2016-07-23 DIAGNOSIS — G441 Vascular headache, not elsewhere classified: Secondary | ICD-10-CM | POA: Diagnosis not present

## 2016-07-23 DIAGNOSIS — M9902 Segmental and somatic dysfunction of thoracic region: Secondary | ICD-10-CM | POA: Diagnosis not present

## 2016-07-23 DIAGNOSIS — M47812 Spondylosis without myelopathy or radiculopathy, cervical region: Secondary | ICD-10-CM | POA: Diagnosis not present

## 2016-07-26 ENCOUNTER — Encounter: Payer: Self-pay | Admitting: Family Medicine

## 2016-07-26 ENCOUNTER — Ambulatory Visit (INDEPENDENT_AMBULATORY_CARE_PROVIDER_SITE_OTHER): Payer: Self-pay | Admitting: Family Medicine

## 2016-07-26 ENCOUNTER — Ambulatory Visit (INDEPENDENT_AMBULATORY_CARE_PROVIDER_SITE_OTHER): Payer: BLUE CROSS/BLUE SHIELD

## 2016-07-26 VITALS — BP 130/84 | HR 84 | Temp 97.9°F | Ht <= 58 in | Wt 126.8 lb

## 2016-07-26 DIAGNOSIS — Z0000 Encounter for general adult medical examination without abnormal findings: Secondary | ICD-10-CM | POA: Diagnosis not present

## 2016-07-26 DIAGNOSIS — M25551 Pain in right hip: Secondary | ICD-10-CM

## 2016-07-26 DIAGNOSIS — Z96642 Presence of left artificial hip joint: Secondary | ICD-10-CM | POA: Diagnosis not present

## 2016-07-26 DIAGNOSIS — Z471 Aftercare following joint replacement surgery: Secondary | ICD-10-CM | POA: Diagnosis not present

## 2016-07-26 DIAGNOSIS — K9041 Non-celiac gluten sensitivity: Secondary | ICD-10-CM

## 2016-07-26 DIAGNOSIS — M79642 Pain in left hand: Secondary | ICD-10-CM | POA: Diagnosis not present

## 2016-07-26 DIAGNOSIS — M4802 Spinal stenosis, cervical region: Secondary | ICD-10-CM | POA: Diagnosis not present

## 2016-07-26 DIAGNOSIS — G8929 Other chronic pain: Secondary | ICD-10-CM | POA: Insufficient documentation

## 2016-07-26 DIAGNOSIS — M4807 Spinal stenosis, lumbosacral region: Secondary | ICD-10-CM | POA: Diagnosis not present

## 2016-07-26 DIAGNOSIS — M79641 Pain in right hand: Secondary | ICD-10-CM | POA: Diagnosis not present

## 2016-07-26 DIAGNOSIS — M25542 Pain in joints of left hand: Secondary | ICD-10-CM | POA: Insufficient documentation

## 2016-07-26 DIAGNOSIS — M25552 Pain in left hip: Secondary | ICD-10-CM

## 2016-07-26 DIAGNOSIS — M542 Cervicalgia: Secondary | ICD-10-CM

## 2016-07-26 DIAGNOSIS — R768 Other specified abnormal immunological findings in serum: Secondary | ICD-10-CM

## 2016-07-26 DIAGNOSIS — M545 Low back pain: Secondary | ICD-10-CM

## 2016-07-26 DIAGNOSIS — M549 Dorsalgia, unspecified: Secondary | ICD-10-CM | POA: Insufficient documentation

## 2016-07-26 HISTORY — DX: Non-celiac gluten sensitivity: K90.41

## 2016-07-26 LAB — CBC WITH DIFFERENTIAL/PLATELET
Basophils Absolute: 0 10*3/uL (ref 0.0–0.1)
Basophils Relative: 0.9 % (ref 0.0–3.0)
Eosinophils Absolute: 0.1 10*3/uL (ref 0.0–0.7)
Eosinophils Relative: 1.3 % (ref 0.0–5.0)
HCT: 42 % (ref 36.0–46.0)
Hemoglobin: 14 g/dL (ref 12.0–15.0)
Lymphocytes Relative: 22.4 % (ref 12.0–46.0)
Lymphs Abs: 1.1 10*3/uL (ref 0.7–4.0)
MCHC: 33.3 g/dL (ref 30.0–36.0)
MCV: 97.7 fl (ref 78.0–100.0)
Monocytes Absolute: 0.4 10*3/uL (ref 0.1–1.0)
Monocytes Relative: 9.3 % (ref 3.0–12.0)
Neutro Abs: 3.1 10*3/uL (ref 1.4–7.7)
Neutrophils Relative %: 66.1 % (ref 43.0–77.0)
Platelets: 216 10*3/uL (ref 150.0–400.0)
RBC: 4.3 Mil/uL (ref 3.87–5.11)
RDW: 12.7 % (ref 11.5–15.5)
WBC: 4.7 10*3/uL (ref 4.0–10.5)

## 2016-07-26 LAB — COMPREHENSIVE METABOLIC PANEL
ALT: 17 U/L (ref 0–35)
AST: 24 U/L (ref 0–37)
Albumin: 4.7 g/dL (ref 3.5–5.2)
Alkaline Phosphatase: 45 U/L (ref 39–117)
BUN: 14 mg/dL (ref 6–23)
CO2: 30 mEq/L (ref 19–32)
Calcium: 9.7 mg/dL (ref 8.4–10.5)
Chloride: 100 mEq/L (ref 96–112)
Creatinine, Ser: 0.92 mg/dL (ref 0.40–1.20)
GFR: 66.96 mL/min (ref >60.00)
Glucose, Bld: 86 mg/dL (ref 70–99)
Potassium: 4 mEq/L (ref 3.5–5.1)
Sodium: 137 mEq/L (ref 135–145)
Total Bilirubin: 0.5 mg/dL (ref 0.2–1.2)
Total Protein: 7 g/dL (ref 6.0–8.3)

## 2016-07-26 LAB — C-REACTIVE PROTEIN: CRP: 0.1 mg/dL — ABNORMAL LOW (ref 0.5–20.0)

## 2016-07-26 LAB — SEDIMENTATION RATE: Sed Rate: 3 mm/hr (ref 0–30)

## 2016-07-26 NOTE — Progress Notes (Signed)
Wendy Davies is a 56 y.o. female is here to [facility].   Patient Care Team: Helane Rima, DO as PCP - General (Family Medicine)   History of Present Illness:   Wendy Davies CMA acting as scribe for Dr. Earlene Plater.  HPI Patient comes in today to establish care. She would like to discuss hand swelling that she has started having. She thinks that it may be due to a wheat intolerance. Patient states that she has started cutting out some wheat products.   1. Neck pain. Chronic. Worsening. No UE weakness.   2. Pain in both hands. Multiple joints. Intermittent swelling.   3. Low back pain. Chronic. Worsening. No new injury. No consistent treatment. No LE weakness or falls.    4. Pain of both hip joints. Hx of replacement. Continued pain. No new injury.   5. Gluten intolerance. Joint pain and edema worse with eating wheat products.    Health Maintenance Due  Topic Date Due  . Hepatitis C Screening  1960/04/01  . HIV Screening  12/07/1974  . TETANUS/TDAP  12/07/1978  . PAP SMEAR  12/06/1980  . COLONOSCOPY  12/06/2009   PMHx, SurgHx, SocialHx, Medications, and Allergies were reviewed in the Visit Navigator and updated as appropriate.   No past medical history on file.  Past Surgical History:  Procedure Laterality Date  . ABDOMINAL HYSTERECTOMY    . CESAREAN SECTION    . JOINT REPLACEMENT  1997   Rt hip:  [employer]    Family History  Problem Relation Age of Onset  . Diabetes Mother   . Diabetes Brother   . Hypertension Brother      Social History  Substance Use Topics  . Smoking status: Never Smoker  . Smokeless tobacco: Never Used  . Alcohol use Yes   Current Medications and Allergies:   Current Outpatient Prescriptions:  .  DIVIGEL 1 MG/GM GEL, , Disp: , Rfl: 0 .  montelukast (SINGULAIR) 10 MG tablet, Take 10 mg by mouth every evening., Disp: , Rfl: 0  Allergies  Allergen Reactions  . Hydromorphone   . Morphine And Related   . Pollen Extract     . Pork-Derived Products    Review of Systems:   Review of Systems  Constitutional: Negative for chills, fever and malaise/fatigue.  HENT: Negative for ear pain, sinus pain and sore throat.   Eyes: Negative for blurred vision and double vision.  Respiratory: Negative for cough, shortness of breath and wheezing.   Cardiovascular: Negative for chest pain, palpitations and leg swelling.  Gastrointestinal: Negative for abdominal pain, nausea and vomiting.  Musculoskeletal: Positive for back pain, joint pain and neck pain.  Neurological: Negative for dizziness and headaches.  Psychiatric/Behavioral: Negative for depression, hallucinations and memory loss.   Vitals:   Vitals:   07/26/16 0748  BP: 130/84  Pulse: 84  Temp: 97.9 F (36.6 C)  TempSrc: Oral  SpO2: 98%  Weight: 126 lb 12.8 oz (57.5 kg)  Height: 4\' 10"  (1.473 m)     Body mass index is 26.5 kg/m.  Physical Exam:   Physical Exam  Constitutional: She appears well-developed and well-nourished. No distress.  HENT:  Head: Normocephalic and atraumatic.  Eyes: EOM are normal. Pupils are equal, round, and reactive to light.  Neck: Normal range of motion. Neck supple.  Cardiovascular: Normal rate, regular rhythm, normal heart sounds and intact distal pulses.   Pulmonary/Chest: Effort normal.  Abdominal: Soft.  Skin: Skin is warm.  Psychiatric: She has  a normal mood and affect. Her behavior is normal.  Nursing note and vitals reviewed.  Assessment and Plan:   Wendy Davies was seen today for establish care. She has pain is multiple joints. 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 Concern for osteoporotic bone near prosthesis. Will discuss treatment with the patient. Inflammatory labs pending.   Diagnoses and all orders for this visit:  Neck pain -     DG Cervical Spine 2 or 3 views; Future  Pain in both hands -     C-reactive protein -     Sedimentation rate -     Rheumatoid factor  Low back pain, unspecified back pain  laterality, unspecified chronicity, with sciatica presence unspecified -     DG Lumbar Spine 2-3 Views; Future  Pain of both hip joints -     DG HIPS BILAT W OR W/O PELVIS MIN 5 VIEWS; Future  Gluten intolerance -     CBC with Differential/Platelet -     Comprehensive metabolic panel -     Gliadin antibodies, serum -     Tissue transglutaminase, IgA -     Reticulin Antibody, IgA w reflex titer   Neck pain CLINICAL DATA:  Chronic cervicalgia  EXAM: CERVICAL SPINE - 2-3 VIEW  COMPARISON:  None.  FINDINGS: Frontal, lateral, and open-mouth odontoid images obtained. There is no fracture. There is 3 mm of C7 on T1. There is no other appreciable spondylolisthesis. Prevertebral soft tissues and predental space regions are normal. There is severe disc space narrowing at C5-6 and C6-7 with moderate disc space narrowing at C4-5. There is slight disc space narrowing at C7. No erosive change. There is mild reversal of lordotic curvature. Lung apices are clear.  IMPRESSION: Multilevel arthropathy. Slight spondylolisthesis C7-T1 is felt to be due to underlying spondylosis. No fracture. Reversal of lordotic curvature is likely due to chronic muscle spasm.  Back pain CLINICAL DATA:  Chronic lumbago  EXAM: LUMBAR SPINE - 2-3 VIEW  COMPARISON:  None.  FINDINGS: Frontal, lateral, and spot lumbosacral lateral images were obtained. There are 5 non-rib-bearing lumbar type vertebral bodies. There is no lumbar region fracture or spondylolisthesis. There is severe disc space narrowing at L5-S1 with anterior osteophytes at L5 and S1. No erosive change. There is a total hip replacement on the right. 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 There is remodeling in this area.  IMPRESSION: Osteoarthritic change at L5-S1. No lumbar region fracture or spondylolisthesis. Total hip replacement on the right. Evidence of old fracture of the  lateral superior pubic ramus on the right with remodeling and a degree of nonunion.    Hip pain CLINICAL DATA:  Chronic pain  EXAM: DG HIP (WITH OR WITHOUT PELVIS) 5+V BILAT  COMPARISON:  None.  FINDINGS: Frontal pelvis as well as frontal and lateral views of each hip -total five views -obtained. There is a total hip prosthesis on the right. 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 There is evidence of old trauma involving the right acetabulum with remodeling. 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 No acute fracture or dislocation evident. Left hip joint appears normal. There is evidence of old trauma with remodeling along the lateral right superior iliac crest. 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  IMPRESSION: Old trauma involving multiple sites in the right pelvis. 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 No acute fracture or dislocation. 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 Left hip joint appears unremarkable.  . Reviewed expectations re: course of current medical issues. . Discussed self-management of symptoms. . Outlined signs and symptoms indicating need for more acute intervention. . Patient verbalized understanding and all questions were answered. Marland Kitchen Health Maintenance issues including appropriate healthy diet, exercise, and smoking avoidance were discussed with patient. . See orders for this visit as documented in the electronic medical record. . Patient received an After Visit Summary.  CMA served as Neurosurgeon during this visit. History, Physical, and Plan performed by medical provider. The above documentation has been reviewed and is accurate and complete. Helane Rima, D.O.  Helane Rima, DO [place]  [place] 07/29/2016  Future Appointments Date Time Provider Department Center  01/16/2017 7:45 AM Helane Rima, DO [facility] None

## 2016-07-26 NOTE — Patient Instructions (Addendum)
Peggye LeyOLAND M JONES DDS [address]  [place], [place] [id number]

## 2016-07-27 LAB — RHEUMATOID FACTOR: Rhuematoid fact SerPl-aCnc: 16 IU/mL — ABNORMAL HIGH (ref <14)

## 2016-07-29 ENCOUNTER — Encounter: Payer: Self-pay | Admitting: Family Medicine

## 2016-07-29 NOTE — Assessment & Plan Note (Signed)
CLINICAL DATA:  Chronic cervicalgia  EXAM: CERVICAL SPINE - 2-3 VIEW  COMPARISON:  None.  FINDINGS: Frontal, lateral, and open-mouth odontoid images obtained. There is no fracture. There is 3 mm of C7 on T1. There is no other appreciable spondylolisthesis. Prevertebral soft tissues and predental space regions are normal. There is severe disc space narrowing at C5-6 and C6-7 with moderate disc space narrowing at C4-5. There is slight disc space narrowing at C7. No erosive change. There is mild reversal of lordotic curvature. Lung apices are clear.  IMPRESSION: Multilevel arthropathy. Slight spondylolisthesis C7-T1 is felt to be due to underlying spondylosis. No fracture. Reversal of lordotic curvature is likely due to chronic muscle spasm.

## 2016-07-29 NOTE — Assessment & Plan Note (Signed)
CLINICAL DATA:  Chronic lumbago  EXAM: LUMBAR SPINE - 2-3 VIEW  COMPARISON:  None.  FINDINGS: Frontal, lateral, and spot lumbosacral lateral images were obtained. There are 5 non-rib-bearing lumbar type vertebral bodies. There is no lumbar region fracture or spondylolisthesis. There is severe disc space narrowing at L5-S1 with anterior osteophytes at L5 and S1. No erosive change. There is a total hip replacement on the right. 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 There is remodeling in this area.  IMPRESSION: Osteoarthritic change at L5-S1. No lumbar region fracture or spondylolisthesis. Total hip replacement on the right. Evidence of old fracture of the lateral superior pubic ramus on the right with remodeling and a degree of nonunion.

## 2016-07-29 NOTE — Assessment & Plan Note (Signed)
CLINICAL DATA:  Chronic pain  EXAM: DG HIP (WITH OR WITHOUT PELVIS) 5+V BILAT  COMPARISON:  None.  FINDINGS: Frontal pelvis as well as frontal and lateral views of each hip -total five views -obtained. There is a total hip prosthesis on the right. 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 There is evidence of old trauma involving the right acetabulum with remodeling. 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 No acute fracture or dislocation evident. Left hip joint appears normal. There is evidence of old trauma with remodeling along the lateral right superior iliac crest. 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  IMPRESSION: Old trauma involving multiple sites in the right pelvis. 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 No acute fracture or dislocation. 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 Left hip joint appears unremarkable.

## 2016-07-30 DIAGNOSIS — M9901 Segmental and somatic dysfunction of cervical region: Secondary | ICD-10-CM | POA: Diagnosis not present

## 2016-07-30 DIAGNOSIS — M47812 Spondylosis without myelopathy or radiculopathy, cervical region: Secondary | ICD-10-CM | POA: Diagnosis not present

## 2016-07-30 DIAGNOSIS — G441 Vascular headache, not elsewhere classified: Secondary | ICD-10-CM | POA: Diagnosis not present

## 2016-07-30 DIAGNOSIS — M9902 Segmental and somatic dysfunction of thoracic region: Secondary | ICD-10-CM | POA: Diagnosis not present

## 2016-07-30 LAB — GLIADIN ANTIBODIES, SERUM
Gliadin IgA: 4 Units (ref <20)
Gliadin IgG: 2 Units (ref <20)

## 2016-07-30 LAB — RETICULIN ANTIBODIES, IGA W TITER: Reticulin Ab, IgA: NEGATIVE

## 2016-07-30 LAB — TISSUE TRANSGLUTAMINASE, IGA: Tissue Transglutaminase Ab, IgA: 1 U/mL (ref <4)

## 2016-08-03 ENCOUNTER — Telehealth: Payer: Self-pay

## 2016-08-03 NOTE — Telephone Encounter (Signed)
Pt coming for labs 08/07/16. Please place future orders. Thank you.

## 2016-08-06 NOTE — Addendum Note (Signed)
Addended by: Helane RimaWALLACE, Anshi Jalloh R on: 08/06/2016 10:32 AM   Modules accepted: Orders

## 2016-08-06 NOTE — Addendum Note (Signed)
Addended by: Felix AhmadiFRANSEN, Breunna Nordmann A on: 08/06/2016 11:22 AM   Modules accepted: Orders

## 2016-08-06 NOTE — Telephone Encounter (Signed)
Can you please place the order for the other labs you wanted to order.

## 2016-08-07 ENCOUNTER — Other Ambulatory Visit (INDEPENDENT_AMBULATORY_CARE_PROVIDER_SITE_OTHER): Payer: BLUE CROSS/BLUE SHIELD

## 2016-08-07 DIAGNOSIS — R768 Other specified abnormal immunological findings in serum: Secondary | ICD-10-CM | POA: Diagnosis not present

## 2016-08-07 DIAGNOSIS — K9041 Non-celiac gluten sensitivity: Secondary | ICD-10-CM

## 2016-08-09 LAB — CYCLIC CITRUL PEPTIDE ANTIBODY, IGG: Cyclic Citrullin Peptide Ab: <16 Units

## 2016-08-13 DIAGNOSIS — G441 Vascular headache, not elsewhere classified: Secondary | ICD-10-CM | POA: Diagnosis not present

## 2016-08-13 DIAGNOSIS — M9902 Segmental and somatic dysfunction of thoracic region: Secondary | ICD-10-CM | POA: Diagnosis not present

## 2016-08-13 DIAGNOSIS — M9901 Segmental and somatic dysfunction of cervical region: Secondary | ICD-10-CM | POA: Diagnosis not present

## 2016-08-13 DIAGNOSIS — M47812 Spondylosis without myelopathy or radiculopathy, cervical region: Secondary | ICD-10-CM | POA: Diagnosis not present

## 2016-08-20 DIAGNOSIS — M47812 Spondylosis without myelopathy or radiculopathy, cervical region: Secondary | ICD-10-CM | POA: Diagnosis not present

## 2016-08-20 DIAGNOSIS — M9902 Segmental and somatic dysfunction of thoracic region: Secondary | ICD-10-CM | POA: Diagnosis not present

## 2016-08-20 DIAGNOSIS — M9901 Segmental and somatic dysfunction of cervical region: Secondary | ICD-10-CM | POA: Diagnosis not present

## 2016-08-20 DIAGNOSIS — G441 Vascular headache, not elsewhere classified: Secondary | ICD-10-CM | POA: Diagnosis not present

## 2016-08-23 ENCOUNTER — Ambulatory Visit (INDEPENDENT_AMBULATORY_CARE_PROVIDER_SITE_OTHER): Payer: BLUE CROSS/BLUE SHIELD | Admitting: Family Medicine

## 2016-08-23 ENCOUNTER — Encounter: Payer: Self-pay | Admitting: Family Medicine

## 2016-08-23 VITALS — BP 120/76 | HR 71 | Temp 98.4°F | Ht <= 58 in | Wt 129.2 lb

## 2016-08-23 DIAGNOSIS — Z96641 Presence of right artificial hip joint: Secondary | ICD-10-CM

## 2016-08-23 DIAGNOSIS — G8929 Other chronic pain: Secondary | ICD-10-CM | POA: Diagnosis not present

## 2016-08-23 DIAGNOSIS — M25551 Pain in right hip: Secondary | ICD-10-CM

## 2016-08-23 DIAGNOSIS — K9041 Non-celiac gluten sensitivity: Secondary | ICD-10-CM

## 2016-08-23 DIAGNOSIS — M25541 Pain in joints of right hand: Secondary | ICD-10-CM

## 2016-08-23 DIAGNOSIS — L409 Psoriasis, unspecified: Secondary | ICD-10-CM | POA: Diagnosis not present

## 2016-08-23 DIAGNOSIS — M25542 Pain in joints of left hand: Secondary | ICD-10-CM | POA: Diagnosis not present

## 2016-08-23 DIAGNOSIS — L4050 Arthropathic psoriasis, unspecified: Secondary | ICD-10-CM | POA: Diagnosis not present

## 2016-08-23 DIAGNOSIS — M62838 Other muscle spasm: Secondary | ICD-10-CM

## 2016-08-23 DIAGNOSIS — Z8739 Personal history of other diseases of the musculoskeletal system and connective tissue: Secondary | ICD-10-CM | POA: Diagnosis not present

## 2016-08-23 DIAGNOSIS — R938 Abnormal findings on diagnostic imaging of other specified body structures: Secondary | ICD-10-CM

## 2016-08-23 DIAGNOSIS — R9389 Abnormal findings on diagnostic imaging of other specified body structures: Secondary | ICD-10-CM

## 2016-08-23 MED ORDER — DIAZEPAM 2 MG PO TABS: 2.0000 mg | ORAL_TABLET | Freq: Every evening | ORAL | 0 refills | Status: DC | PRN

## 2016-08-23 NOTE — Progress Notes (Signed)
Wendy KoyanagiLori Davies is a 56 y.o. female is here for follow up.  History of Present Illness:   Wendy Davies CMA acting as scribe for Dr. Earlene PlaterWallace.  HPI: Please see assessment and plan for problem based charting.  Health Maintenance Due  Topic Date Due  . Hepatitis C Screening  Aug 22, 1960  . HIV Screening  12/07/1974  . TETANUS/TDAP  12/07/1978  . COLONOSCOPY  12/06/2009   PMHx, SurgHx, SocialHx, FamHx, Medications, and Allergies were reviewed in the Visit Navigator and updated as appropriate.   Patient Active Problem List   Diagnosis Date Noted  . Psoriatic arthritis (HCC) 08/26/2016  . 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 Spasm of cervical paraspinous muscle 08/26/2016  . Joint pain in fingers of right hand 08/26/2016  . 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 History of right hip replacement 08/26/2016  . Joint pain in fingers of left hand 07/26/2016  . Back pain 07/26/2016  . Chronic pain of right hip 07/26/2016  . Gluten intolerance 07/26/2016   Social History  Substance Use Topics  . Smoking status: Never Smoker  . Smokeless tobacco: Never Used  . Alcohol use Yes   Current Medications and Allergies:   .  DIVIGEL 1 MG/GM GEL, , Disp: , Rfl: 0 .  montelukast (SINGULAIR) 10 MG tablet, Take 10 mg by mouth every evening., Disp: , Rfl: 0  Allergies  Allergen Reactions  . Hydromorphone   . Morphine And Related   . Pollen Extract   . Pork-Derived Products    Review of Systems   Pertinent items are noted in the HPI. Otherwise, ROS is negative.  Vitals:   Vitals:   08/23/16 0739  BP: 120/76  Pulse: 71  Temp: 98.4 F (36.9 C)  TempSrc: Oral  SpO2: 99%  Weight: 129 lb 3.2 oz (58.6 kg)  Height: 4\' 10"  (1.473 m)    Body mass index is 27 kg/m.  Physical Exam:   Physical Exam  Constitutional: She appears well-developed and well-nourished. No distress.  HENT:  Head: Normocephalic and atraumatic.  Eyes: Pupils are equal, round, and reactive to  light. EOM are normal.  Neck: Normal range of motion. Neck supple.  Cardiovascular: Normal rate, regular rhythm, normal heart sounds and intact distal pulses.   Pulmonary/Chest: Effort normal.  Abdominal: Soft.  Skin: Skin is warm.  Psychiatric: She has a normal mood and affect. Her behavior is normal.  Nursing note and vitals reviewed.   IMPRESSION: Old trauma involving multiple sites in the right pelvis. 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 No acute fracture or dislocation. 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 Left hip joint appears unremarkable.  IMPRESSION: Osteoarthritic change at L5-S1. No lumbar region fracture or spondylolisthesis. Total hip replacement on the right. Evidence of old fracture of the lateral superior pubic ramus on the right with remodeling and a degree of nonunion.  IMPRESSION: Multilevel arthropathy. Slight spondylolisthesis C7-T1 is felt to be due to underlying spondylosis. No fracture. Reversal of lordotic curvature is likely due to chronic muscle spasm.  Assessment and Plan:   Problem  Psoriatic Arthritis (Hcc)   Lab Results  Component Value Date   ESRSEDRATE 3 07/26/2016   Lab Results  Component Value Date   CRP 0.1 (L) 07/26/2016   Orders Placed This Encounter  Procedures  . Ambulatory referral to Rheumatology     Psoriasis of Scalp   Spasm of Cervical  Paraspinous Muscle   XRAY IMPRESSION: Multilevel arthropathy. Slight spondylolisthesis C7-T1 is felt to be due to underlying spondylosis. No fracture. Reversal of lordotic curvature is likely due to chronic muscle spasm.  Meds ordered this encounter  Medications  . diazepam (VALIUM) 2 MG tablet    Sig: Take 1 tablet (2 mg total) by mouth at bedtime as needed for muscle spasms.    Dispense:  20 tablet    Refill:  0   Orders Placed This Encounter  Procedures  . AMB referral to  Sports Medicine     Joint Pain in Fingers of Right Hand   Fingers 2 through 5. Her PIP joints are hypertrophic. She does endorse difficulty when she awakens. She is interested in seeing rheumatology at this point for a discussion on whether or not medication would help with decreasing progression of her disease.  Orders Placed This Encounter  Procedures  . 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 Her PIP joints are hypertrophic. She does endorse difficulty when she awakens. She is interested in seeing rheumatology at this point for a discussion on whether or not medication would help with decreasing progression of her disease.  Orders Placed This Encounter  Procedures  . Ambulatory referral to Rheumatology     Chronic Pain of Right Hip   Patient with history of Graves' disease as a child. She is status post right hip replacement. This was prolonged traumatic process for her. Her recent x-ray indicated evidence of old lateral superior pubic ramus fracture on the right with a degree of nonunion. Patient does not remember a fracture. I am unsure if this is related to her previous surgery. The patient does want to become established with an orthopedist, so I am referring her out today. She was given copies of her x-rays to bring with her. There was also noted osteoporotic bone surrounding her hip replacement hardware. Will consider DEXA. Again, I am unsure if this is related to her prior disease and hip replacement.   Gluten Intolerance   Previous celiac panel labs were negative. However, the patient does feel better when she follows a paleo diet. This is very restrictive for her, so she does her best. She does overall feel that her symptoms have improved.    Orders Placed This Encounter  Procedures  . Ambulatory referral to Rheumatology    Referral  Priority:   Routine    Referral Type:   Consultation    Referral Reason:   Specialty Services Required    Requested Specialty:   Rheumatology    Number of Visits Requested:   1  . AMB referral to sports medicine    Referral Priority:   Routine    Referral Type:   Consultation    Number of Visits Requested:   1  . Ambulatory referral to Orthopedic Surgery    Referral Priority:   Routine    Referral Type:   Surgical    Referral Reason:   Specialty Services Required    Requested Specialty:   Orthopedic Surgery    Number of Visits Requested:   1   Meds ordered this encounter  Medications  . diazepam (VALIUM) 2 MG tablet    Sig: Take 1 tablet (2 mg total) by mouth at bedtime as needed for muscle spasms.    Dispense:  20 tablet    Refill:  0   . Reviewed expectations re: course of current medical issues. . Discussed self-management of symptoms. . Outlined signs and symptoms indicating need for more acute intervention. . Patient verbalized understanding and all questions were answered. Marland Kitchen Health Maintenance issues including appropriate healthy diet, exercise, and smoking avoidance were discussed with patient. . See orders for this visit as documented in the electronic medical record. . Patient received an After Visit Summary.  CMA served as Neurosurgeon during this visit. History, Physical, and Plan performed by medical provider. The above documentation has been reviewed and is accurate and complete. Helane Rima, D.O.  Helane Rima, DO [place] [place] 08/26/2016  Future Appointments Date Time Provider Department Center  08/28/2016 8:30 AM Andrena Mews, DO [facility] None  01/16/2017 7:45 AM Helane Rima, DO [facility] None

## 2016-08-26 DIAGNOSIS — L409 Psoriasis, unspecified: Secondary | ICD-10-CM | POA: Insufficient documentation

## 2016-08-26 DIAGNOSIS — Z96641 Presence of right artificial hip joint: Secondary | ICD-10-CM | POA: Insufficient documentation

## 2016-08-26 DIAGNOSIS — L4050 Arthropathic psoriasis, unspecified: Secondary | ICD-10-CM | POA: Insufficient documentation

## 2016-08-26 DIAGNOSIS — Z8739 Personal history of other diseases of the musculoskeletal system and connective tissue: Secondary | ICD-10-CM

## 2016-08-26 DIAGNOSIS — M25541 Pain in joints of right hand: Secondary | ICD-10-CM | POA: Insufficient documentation

## 2016-08-26 DIAGNOSIS — M62838 Other muscle spasm: Secondary | ICD-10-CM | POA: Insufficient documentation

## 2016-08-26 HISTORY — DX: Arthropathic psoriasis, unspecified: L40.50

## 2016-08-26 HISTORY — DX: Personal history of other diseases of the musculoskeletal system and connective tissue: Z87.39

## 2016-08-27 DIAGNOSIS — M47812 Spondylosis without myelopathy or radiculopathy, cervical region: Secondary | ICD-10-CM | POA: Diagnosis not present

## 2016-08-27 DIAGNOSIS — M9901 Segmental and somatic dysfunction of cervical region: Secondary | ICD-10-CM | POA: Diagnosis not present

## 2016-08-27 DIAGNOSIS — G441 Vascular headache, not elsewhere classified: Secondary | ICD-10-CM | POA: Diagnosis not present

## 2016-08-27 DIAGNOSIS — M9902 Segmental and somatic dysfunction of thoracic region: Secondary | ICD-10-CM | POA: Diagnosis not present

## 2016-08-28 ENCOUNTER — Ambulatory Visit (INDEPENDENT_AMBULATORY_CARE_PROVIDER_SITE_OTHER): Payer: BLUE CROSS/BLUE SHIELD | Admitting: Sports Medicine

## 2016-08-28 ENCOUNTER — Ambulatory Visit: Payer: Self-pay

## 2016-08-28 VITALS — BP 124/88 | HR 68 | Ht <= 58 in | Wt 129.0 lb

## 2016-08-28 DIAGNOSIS — M4722 Other spondylosis with radiculopathy, cervical region: Secondary | ICD-10-CM

## 2016-08-28 DIAGNOSIS — L4050 Arthropathic psoriasis, unspecified: Secondary | ICD-10-CM | POA: Diagnosis not present

## 2016-08-28 DIAGNOSIS — M25511 Pain in right shoulder: Secondary | ICD-10-CM | POA: Diagnosis not present

## 2016-08-28 DIAGNOSIS — L409 Psoriasis, unspecified: Secondary | ICD-10-CM

## 2016-08-28 DIAGNOSIS — G5603 Carpal tunnel syndrome, bilateral upper limbs: Secondary | ICD-10-CM

## 2016-08-28 DIAGNOSIS — G8929 Other chronic pain: Secondary | ICD-10-CM | POA: Insufficient documentation

## 2016-08-28 DIAGNOSIS — M25551 Pain in right hip: Secondary | ICD-10-CM | POA: Diagnosis not present

## 2016-08-28 DIAGNOSIS — M25541 Pain in joints of right hand: Secondary | ICD-10-CM | POA: Diagnosis not present

## 2016-08-28 DIAGNOSIS — M62838 Other muscle spasm: Secondary | ICD-10-CM

## 2016-08-28 HISTORY — DX: Other spondylosis with radiculopathy, cervical region: M47.22

## 2016-08-28 HISTORY — DX: Carpal tunnel syndrome, bilateral upper limbs: G56.03

## 2016-08-28 MED ORDER — AMITRIPTYLINE HCL 25 MG PO TABS: 25.0000 mg | ORAL_TABLET | Freq: Every day | ORAL | 3 refills | Status: DC

## 2016-08-28 NOTE — Progress Notes (Signed)
OFFICE VISIT NOTE Veverly FellsMichael D. Delorise Shinerigby, DO  [facility] [phone]  Burman BlacksmithLori Bartl - 56 y.o. female MRN [id number]  Date of birth: 01/21/1961  Visit Date: 08/28/2016  PCP: Helane RimaWallace, Erica, DO   Referred by: Helane RimaWallace, Erica, DO  Autumn McNeil, cma acting as scribe for Dr. Berline Choughigby.  SUBJECTIVE:   Chief Complaint  Patient presents with  . Spasm Of Cervical Paraspinous Muscle   HPI: As below and per problem based documentation when appropriate.   NECK PAIN: SX are positional. Pt has decreased her tennis play time due to sx being triggered if she plays more than 3 times per week. Sx initially started x6 years ago when she was playing everyday. Location is on both LT and RT side of neck. There is radiation of pain to RT shoulder. She is icing the area with some relief. [facility], if she did not go she would not be able to move her neck. Xray on 07-26-2016 of cervical spine. Took 2 doses of Diazepam but unsure if it helped. Reports that medication has kept her up at bedtime.    Review of Systems  Constitutional: Negative for chills, diaphoresis, fever, malaise/fatigue and weight loss.  HENT: Negative.   Eyes: Negative.   Respiratory: Negative.   Cardiovascular: Negative.   Gastrointestinal: Negative.   Genitourinary: Negative.   Musculoskeletal: Positive for joint pain and myalgias. Negative for back pain, falls and neck pain.  Skin: Negative for itching and rash.  Neurological: Negative.  Negative for weakness.  Endo/Heme/Allergies: Negative for environmental allergies and polydipsia. Does not bruise/bleed easily.  Psychiatric/Behavioral: Negative.     Otherwise per HPI.  HISTORY & PERTINENT PRIOR DATA:  No specialty comments available. She reports that she has never smoked. She has never used smokeless tobacco. No results for input(s): HGBA1C, LABURIC in the last 8760 hours. Medications & Allergies  reviewed per EMR Patient Active Problem List   Diagnosis Date Noted  . Rotator cuff impingement syndrome of right shoulder 09/26/2016  . Bilateral carpal tunnel syndrome 08/28/2016  . Osteoarthritis of spine with radiculopathy, cervical region 08/28/2016  . Chronic right shoulder pain 08/28/2016  . Psoriatic arthritis (HCC) 08/26/2016  . 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 Spasm of cervical paraspinous muscle 08/26/2016  . Joint pain in fingers of right hand 08/26/2016  . 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 History of right hip replacement 08/26/2016  . Joint pain in fingers of left hand 07/26/2016  . Back pain 07/26/2016  . Chronic pain of right hip 07/26/2016  . Gluten intolerance 07/26/2016   No past medical history on file. Family History  Problem Relation Age of Onset  . Diabetes Mother   . Diabetes Brother   . Hypertension Brother    Past Surgical History:  Procedure Laterality Date  . ABDOMINAL HYSTERECTOMY    . CESAREAN SECTION    . JOINT REPLACEMENT  1997   Rt hip:  [employer]   Social History   Occupational History  . Not on file.   Social History Main Topics  . Smoking status: Never Smoker  . Smokeless tobacco: Never Used  . Alcohol use Yes  . Drug use: No  . Sexual activity: Yes    OBJECTIVE:  VS:  HT:4\' 10"  (147.3 cm)   WT:129 lb (58.5 kg)  BMI:27    BP:124/88  HR:68bpm  TEMP: ( )  RESP:99 % EXAM: Findings:  WDWN, NAD, Non-toxic appearing Alert & appropriately interactive Not depressed or anxious appearing No increased work of breathing. Pupils are equal. EOM intact without nystagmus No clubbing or cyanosis of the extremities appreciated No significant rashes/lesions/ulcerations overlying the examined area.  She does have some changes of the nailbeds bilaterally consistent with psoriatic changes as well as mild psoriatic changes over the extensor surfaces. Radial pulses 2+/4.  No significant generalized UE  edema. Sensation intact to light touch in upper extremities although she has a generalized dysesthesia and the median nerve distribution bilaterally.  Bilateral hands: Small amount of thenar atrophy bilaterally.  Grip strength is intact but diminished on the right compared to left, she is right-hand dominant.  She has pain with carpal tunnel compression test and Phalen's.  Only mild exacerbation of symptoms with Tinel's.  Her cervical range of motion is significantly limited and she has pain with Spurling's compression test on the right greater than left.  Negative Lhermitte's compression test.  She is negative Hoffman's reflex.  Upper extremity DTRs are symmetric.  Otherwise upper extremity strength is 5+/5 in all myotomes.  Her right shoulder has a small amount of discomfort with  resisted empty can testing but strength is intact.  No significant pain with Hawkins or Neer's.   Right hip is overall well aligned.  She has good range of motion with internal and external rotation.  She has no pain with straight leg raise.  Negative Stinchfield FADIR.  Well-healed surgical incision.  No significant pain with palpation of the lumbar midline palpation.  Generalized paraspinal tenderness.   [place] Limited Joint Space Structures Up Right(no Linked Charges)  Result Date: 08/28/2016 Andrena Mews, DO     09/25/2016  8:40 AM LIMITED MSK ULTRASOUND OF bilateral wrists Images were obtained and interpreted by myself, Gaspar Bidding, DO Images have been saved and stored to PACS system. Images obtained on: GE S7 Ultrasound machine FINDINGS:  Right median nerve measures 0.27 cm  Left median nerve measures 0.21 cm  Both have significant dumbbell deformities. IMPRESSION: 1. Bilateral severe carpal tunnel syndrome based on ultrasound findings, correlate with EMG/nerve conduction studies   ASSESSMENT & PLAN:     ICD-10-CM   1. Spasm of cervical paraspinous muscle M62.838 [place] LIMITED JOINT SPACE STRUCTURES UP RIGHT(NO  LINKED CHARGES)    DISCONTINUED: amitriptyline (ELAVIL) 25 MG tablet  2. Psoriatic arthritis (HCC) L40.50 Ambulatory referral to Physical Medicine Rehab  3. Chronic pain of right hip M25.551    G89.29   4. Psoriasis of scalp L40.9   5. Joint pain in fingers of right hand M25.541   6. Bilateral carpal tunnel syndrome G56.03 Ambulatory referral to Physical Medicine Rehab  7. Osteoarthritis of spine with radiculopathy, cervical region M47.22 Ambulatory referral to Physical Medicine Rehab    DISCONTINUED: amitriptyline (ELAVIL) 25 MG tablet  8. Chronic right shoulder pain M25.511 DISCONTINUED: amitriptyline (ELAVIL) 25 MG tablet   G89.29   ================================================================= Psoriatic arthritis (HCC) Agree with concerns for potential psoriatic arthritis and referral to rheumatology.  She has significant changes of her cervical spine hands cervical, and lumbar spine as well as the aspects of her thoracic spine and imaged.  We appreciate the expertise of our rheumatology partners.  Osteoarthritis of spine with radiculopathy, cervical region Patient has significant multilevel degenerative changes and reversal of her cervical lordosis.  I suspect this is partially due to the potential underlying inflammatory condition.  Given the symptoms that she is complaining of including the fingers as well as hand  would like to refer her over for a nerve conduction study to differentiate between a cervical radiculopathy and carpal tunnel syndrome as well as to characterize the function of the median nerve given the findings on ultrasound today.  Chronic pain of right hip Interestingly she  appears to have nonunion of the superior pubic ramus although she does not recall this injury of her recurring.  Pain is only mild at this time.  Will defer to orthopedics.  Bilateral carpal tunnel syndrome Patient has marked swelling of her bilateral median nerves.  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  Nerve conduction studies nerve conduction studies and EMGs indicated at this time.  If severe on nerve conduction for median neuropathy I am happy to defer to Dr. Thurston Hole for further management.   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  Further characterization with EMG/nerve conduction studies will be our first workup and can consider MRI pending these results.  Trial of amitriptyline at night given the significant nighttime pain she is having.  ================================================================= Follow-up: Return in about 4 weeks (around 09/25/2016) for Also, we will call you about your results from your EMG/NCS.   CMA/ATC served as scribe during this visit. History, Physical, and Plan performed by medical provider. Documentation and orders reviewed and attested to.     Gaspar Bidding, DO    [facility]

## 2016-08-30 ENCOUNTER — Telehealth: Payer: Self-pay | Admitting: Family Medicine

## 2016-08-30 NOTE — Telephone Encounter (Signed)
Mellody DanceKeith can you handle this. [facility]

## 2016-08-30 NOTE — Telephone Encounter (Signed)
Patient would like rheumatology referral to go to [facility].  Thank you,  -LL

## 2016-08-31 ENCOUNTER — Encounter: Payer: Self-pay | Admitting: Family Medicine

## 2016-09-03 DIAGNOSIS — G441 Vascular headache, not elsewhere classified: Secondary | ICD-10-CM | POA: Diagnosis not present

## 2016-09-03 DIAGNOSIS — M9901 Segmental and somatic dysfunction of cervical region: Secondary | ICD-10-CM | POA: Diagnosis not present

## 2016-09-03 DIAGNOSIS — M9902 Segmental and somatic dysfunction of thoracic region: Secondary | ICD-10-CM | POA: Diagnosis not present

## 2016-09-03 DIAGNOSIS — M47812 Spondylosis without myelopathy or radiculopathy, cervical region: Secondary | ICD-10-CM | POA: Diagnosis not present

## 2016-09-05 DIAGNOSIS — G5603 Carpal tunnel syndrome, bilateral upper limbs: Secondary | ICD-10-CM | POA: Diagnosis not present

## 2016-09-06 ENCOUNTER — Encounter: Payer: Self-pay | Admitting: Family Medicine

## 2016-09-06 DIAGNOSIS — G5603 Carpal tunnel syndrome, bilateral upper limbs: Secondary | ICD-10-CM | POA: Diagnosis not present

## 2016-09-12 DIAGNOSIS — Z01419 Encounter for gynecological examination (general) (routine) without abnormal findings: Secondary | ICD-10-CM | POA: Diagnosis not present

## 2016-09-12 DIAGNOSIS — Z6826 Body mass index (BMI) 26.0-26.9, adult: Secondary | ICD-10-CM | POA: Diagnosis not present

## 2016-09-14 DIAGNOSIS — G5601 Carpal tunnel syndrome, right upper limb: Secondary | ICD-10-CM | POA: Diagnosis not present

## 2016-09-14 DIAGNOSIS — G5602 Carpal tunnel syndrome, left upper limb: Secondary | ICD-10-CM | POA: Diagnosis not present

## 2016-09-14 DIAGNOSIS — G5603 Carpal tunnel syndrome, bilateral upper limbs: Secondary | ICD-10-CM | POA: Diagnosis not present

## 2016-09-17 DIAGNOSIS — M9901 Segmental and somatic dysfunction of cervical region: Secondary | ICD-10-CM | POA: Diagnosis not present

## 2016-09-17 DIAGNOSIS — M47812 Spondylosis without myelopathy or radiculopathy, cervical region: Secondary | ICD-10-CM | POA: Diagnosis not present

## 2016-09-17 DIAGNOSIS — G441 Vascular headache, not elsewhere classified: Secondary | ICD-10-CM | POA: Diagnosis not present

## 2016-09-17 DIAGNOSIS — M9902 Segmental and somatic dysfunction of thoracic region: Secondary | ICD-10-CM | POA: Diagnosis not present

## 2016-09-20 DIAGNOSIS — G5603 Carpal tunnel syndrome, bilateral upper limbs: Secondary | ICD-10-CM | POA: Diagnosis not present

## 2016-09-24 DIAGNOSIS — G441 Vascular headache, not elsewhere classified: Secondary | ICD-10-CM | POA: Diagnosis not present

## 2016-09-24 DIAGNOSIS — M9901 Segmental and somatic dysfunction of cervical region: Secondary | ICD-10-CM | POA: Diagnosis not present

## 2016-09-24 DIAGNOSIS — M9902 Segmental and somatic dysfunction of thoracic region: Secondary | ICD-10-CM | POA: Diagnosis not present

## 2016-09-24 DIAGNOSIS — M47812 Spondylosis without myelopathy or radiculopathy, cervical region: Secondary | ICD-10-CM | POA: Diagnosis not present

## 2016-09-25 ENCOUNTER — Ambulatory Visit: Payer: Self-pay

## 2016-09-25 ENCOUNTER — Ambulatory Visit (INDEPENDENT_AMBULATORY_CARE_PROVIDER_SITE_OTHER): Payer: BLUE CROSS/BLUE SHIELD | Admitting: Sports Medicine

## 2016-09-25 VITALS — BP 126/84 | HR 69 | Ht <= 58 in | Wt 129.0 lb

## 2016-09-25 DIAGNOSIS — G5603 Carpal tunnel syndrome, bilateral upper limbs: Secondary | ICD-10-CM | POA: Diagnosis not present

## 2016-09-25 DIAGNOSIS — M7541 Impingement syndrome of right shoulder: Secondary | ICD-10-CM

## 2016-09-25 DIAGNOSIS — G8929 Other chronic pain: Secondary | ICD-10-CM | POA: Diagnosis not present

## 2016-09-25 DIAGNOSIS — M4722 Other spondylosis with radiculopathy, cervical region: Secondary | ICD-10-CM

## 2016-09-25 DIAGNOSIS — M62838 Other muscle spasm: Secondary | ICD-10-CM

## 2016-09-25 DIAGNOSIS — M25511 Pain in right shoulder: Secondary | ICD-10-CM

## 2016-09-25 MED ORDER — NITROGLYCERIN 0.2 MG/HR TD PT24: MEDICATED_PATCH | TRANSDERMAL | 1 refills | Status: DC

## 2016-09-25 MED ORDER — NORTRIPTYLINE HCL 10 MG PO CAPS: 10.0000 mg | ORAL_CAPSULE | Freq: Every day | ORAL | 2 refills | Status: DC

## 2016-09-25 NOTE — Assessment & Plan Note (Signed)
Patient has significant multilevel degenerative changes and reversal of her cervical lordosis.  I suspect this is partially due to the potential underlying inflammatory condition.  Given the symptoms that she is complaining of including the fingers as well as hand would like to refer her over for a nerve conduction study to differentiate between a cervical radiculopathy and carpal tunnel syndrome as well as to characterize the function of the median nerve given the findings on ultrasound today.

## 2016-09-25 NOTE — Patient Instructions (Signed)
We are going to try you on a lower dose of medication for your neck and nerve pain.  ++++++++++++++++++++++++++++++++++++++++++++  Nitroglycerin Protocol   Apply 1/4 nitroglycerin patch to affected area daily.  Change position of patch within the affected area every 24 hours.  You may experience a headache during the first 1-2 weeks of using the patch, these should subside.  If you experience headaches after beginning nitroglycerin patch treatment, you may take your preferred over the counter pain reliever.  Another side effect of the nitroglycerin patch is skin irritation or rash related to patch adhesive.  Please notify our office if you develop more severe headaches or rash, and stop the patch.  Tendon healing with nitroglycerin patch may require 12 to 24 weeks depending on the extent of injury.  Men should not use if taking Viagra, Cialis, or Levitra.   Do not use if you have migraines or rosacea.   ++++++++++++++++++++++++++++++++++++++++++++ Also check out [employer]" which is a program developed by Dr. Myles Lipps.   There are links to a couple of his YouTube Videos below and I would like to see performing one of his videos 5-6 days per week.    A good intro video is: "Independence from Pain 7-minute Video" - [email or web address]   His more advanced video is: "Powerful Posture and Pain Relief: 12 minutes of Foundation Training" - https://youtu.be/[email or web address]  Do not try to attempt this entire video when first beginning.    Try breaking of each exercise that he goes into shorter segments.  Otherwise if they perform an exercise for 45 seconds, start with 15 seconds and rest and then resume with a begin the new activity.    If you work your way up to doing this 12 minute video, I expect you will see significant improvements in your pain.  If you enjoy his videos and would like to find out more you can look on his website: [email or web address].   He has a workout streaming option as well as a DVD set available for purchase.  Amazon has the best price for his DVDs.

## 2016-09-25 NOTE — Progress Notes (Signed)
OFFICE VISIT NOTE Wendy Davies. Wendy Davies [facility] at [facility] [phone]  Wendy Davies - 56 y.o. female MRN [id number]  Date of birth: 10/05/1960  Visit Date: 09/25/2016  PCP: Helane Rima, DO   Referred by: Helane Rima, DO  [name], CMA acting as scribe for Dr. Berline Chough.  SUBJECTIVE:   Chief Complaint  Patient presents with  . Follow-up    neck pain   HPI: As below and per problem based documentation when appropriate.  Wendy Davies is an established patient presenting today in follow-up of neck pain, bilateral carpal tunnel, and rt shoulder pain. She was last seen 08/28/2016 and prescribed Amitriptyline and referred for NCS/EMG Bilateral upper extremities.   Pt reports that she did try taking Amitriptyline but she is not currently taking it because she doesn't like the way it makes her feel the next morning. She says that he did help her with sleep but just "stays with you" the next day. She will take it if she knows she can sleep in the next day. She was referred to nerve conduction study (09/05/16) and that was done and she followed up at [place] [place] (09/06/2016). She had surgery on right wrist 09/14/2016 and steroid injection into the left wrist. She will have her stitches removed Thursday.   She reports continued pain in her neck and rt shoulder. Rt shoulder pain seems to keep her up at night and wakes her from sleep. Pain is about the same as when she was last seen. She stopped playing tennis for a couple of weeks and noticed no improvement in pain.     Review of Systems  Constitutional: Negative for chills and fever.  Respiratory: Negative for shortness of breath and wheezing.   Cardiovascular: Negative for chest pain and palpitations.  Musculoskeletal: Positive for myalgias and neck pain. Negative for falls.  Neurological: Negative for dizziness, tingling and headaches.  Endo/Heme/Allergies: Does not bruise/bleed easily.    Otherwise  per HPI.  HISTORY & PERTINENT PRIOR DATA:  No specialty comments available. She reports that she has never smoked. She has never used smokeless tobacco. No results for input(s): HGBA1C, LABURIC in the last 8760 hours. Medications & Allergies reviewed per EMR Patient Active Problem List   Diagnosis Date Noted  . Rotator cuff impingement syndrome of right shoulder 09/26/2016  . Bilateral carpal tunnel syndrome 08/28/2016  . Osteoarthritis of spine with radiculopathy, cervical region 08/28/2016  . Chronic right shoulder pain 08/28/2016  . Psoriatic arthritis (HCC) 08/26/2016  . 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 Spasm of cervical paraspinous muscle 08/26/2016  . Joint pain in fingers of right hand 08/26/2016  . 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 History of right hip replacement 08/26/2016  . Joint pain in fingers of left hand 07/26/2016  . Back pain 07/26/2016  . Chronic pain of right hip 07/26/2016  . Gluten intolerance 07/26/2016   No past medical history on file. Family History  Problem Relation Age of Onset  . Diabetes Mother   . Diabetes Brother   . Hypertension Brother    Past Surgical History:  Procedure Laterality Date  . ABDOMINAL HYSTERECTOMY    . CESAREAN SECTION    . JOINT REPLACEMENT  1997   Rt hip:  [employer]   Social History   Occupational History  . Not on file.   Social History Main Topics  . Smoking status: Never Smoker  . Smokeless tobacco: Never  Used  . Alcohol use Yes  . Drug use: No  . Sexual activity: Yes    OBJECTIVE:  VS:  HT:4\' 10"  (147.3 cm)   WT:129 lb (58.5 kg)  BMI:26.97    BP:126/84  HR:69bpm  TEMP: ( )  RESP:99 % EXAM: Findings:  WDWN, NAD, Non-toxic appearing Alert & appropriately interactive Not depressed or anxious appearing No increased work of breathing. Pupils are equal. EOM intact without nystagmus No clubbing or cyanosis of the extremities appreciated No significant  rashes/lesions/ulcerations overlying the examined area.  Radial pulses 2+/4.  No significant generalized UE edema. Sensation intact to light touch in upper extremities.    Right hand has overall well approximated surgical incision with sutures that are intact.  No significant surrounding erythema.  She has full flexion extension of her fingers and good capillary refill.  Right shoulder is overall quite muscular and well aligned.  She has full overhead range of motion and pain with terminal abduction. Internal rotation is symmetric.  Her internal rotation and external rotation strength is 5+/5.  She has no focal pai strength is 5 out of 5.  Speeds testing and O'Brien's testing is normal.  Neck is limited cervical rotation and side bending bilaterally.  Mild pain with Spurling's compression test on the right.  Her arm squeeze test and brachial plexus squeeze are painful.  Upper extremity reflexes are bilaterally symmetric.     [place] Limited Joint Space Structures Up Right(no Linked Charges)  Result Date: 09/25/2016 Wendy Mews, DO     09/25/2016  9:18 AM LIMITED MSK ULTRASOUND OF Right Shoulder Images were obtained and interpreted by myself, Gaspar Bidding, DO Images have been saved and stored to PACS system. Images obtained on: GE S7 Ultrasound machine FINDINGS: Biceps Tendon: Within the biceps groove tendon is normal but as it transitions into the intra-articular space there is hypoechoic change as well as thickening consistent with tendinosis Pec Major Insertion: Normal Subscapularis Tendon: Small amount of thickening without significant interstitial tearing Supraspinatus Tendon: Posterior fibers of the articular sided partial-thickness tear with overall good approximate sedation with resistance.  There is minimal neovascularity within the tendon neuropathic changes with a small amount of bursal fluid surrounding this but this is minimal. Infraspinatus/Teres Minor Tendon: Slightly thickened but  overall normal-appearing AC Joint: Minimal degenerative change JOINT: No significant GH spurring appreciated  LABRUM: Not evaluated IMPRESSION: 1. Chronic biceps tendinosis 2. Partial-thickness supraspinatus tear with underlying tendinopathy.    ASSESSMENT & PLAN:     ICD-10-CM   1. Chronic right shoulder pain M25.511 [place] LIMITED JOINT SPACE STRUCTURES UP RIGHT(NO LINKED CHARGES)   G89.29   2. Spasm of cervical paraspinous muscle M62.838   3. Bilateral carpal tunnel syndrome G56.03   4. Osteoarthritis of spine with radiculopathy, cervical region M47.22 nortriptyline (PAMELOR) 10 MG capsule  5. Rotator cuff impingement syndrome of right shoulder M75.41 nitroGLYCERIN (NITRODUR - DOSED IN MG/24 HR) 0.2 mg/hr patch  ================================================================= Spasm of cervical paraspinous muscle Fairly significant cervical spondylosis with some mild neural tension signs is concerning likely underlying cervical radiculitis being a component of her for arm pain.  She does have obvious rotator cuff issues however I am reassured that she is recently undergone nerve conduction studies at sound to be normal with the exception of bilateral carpal tunnel syndrome which she is undergone surgical release for the right at this time.  Injection previously was helpful with I suspect and recommend consideration of surgical release given the prior significant swelling of the  bilateral median nerves.  The left was only 3 mm smaller in diameter than the right at last visit and I suspect that she will require a fairly extensive release similar to the right given the findings on last MSK ultrasound.   Rotator cuff impingement syndrome of right shoulder Thickened supraspinatus with interstitial tearing but no full-thickness component appreciated.  Discussed multiple options with her today including injection versus nitroglycerin therapy and she wished to proceed with biologic therapy.  Therapeutic  exercises for both the shoulder and neck were provided as below.  Plan for likely 12-16 weeks nitroglycerin therapy will recheck in 6 weeks with repeat exam and if symptoms are significantly improved repeat MSK ultrasound.  Osteoarthritis of spine with radiculopathy, cervical region Additionally we will try to change the TCA dosing to see if we can have effect with less side effects. ================================================================= Patient Instructions  We are going to try you on a lower dose of medication for your neck and nerve pain.  ++++++++++++++++++++++++++++++++++++++++++++  Nitroglycerin Protocol   Apply 1/4 nitroglycerin patch to affected area daily.  Change position of patch within the affected area every 24 hours.  You may experience a headache during the first 1-2 weeks of using the patch, these should subside.  If you experience headaches after beginning nitroglycerin patch treatment, you may take your preferred over the counter pain reliever.  Another side effect of the nitroglycerin patch is skin irritation or rash related to patch adhesive.  Please notify our office if you develop more severe headaches or rash, and stop the patch.  Tendon healing with nitroglycerin patch may require 12 to 24 weeks depending on the extent of injury.  Men should not use if taking Viagra, Cialis, or Levitra.   Do not use if you have migraines or rosacea.   ++++++++++++++++++++++++++++++++++++++++++++ Also check out [employer]" which is a program developed by Dr. Myles Lipps.   There are links to a couple of his YouTube Videos below and I would like to see performing one of his videos 5-6 days per week.    A good intro video is: "Independence from Pain 7-minute Video" - [email or web address]   His more advanced video is: "Powerful Posture and Pain Relief: 12 minutes of Foundation Training" - https://[email or web address].be/[email or web address]  Do not try to  attempt this entire video when first beginning.    Try breaking of each exercise that he goes into shorter segments.  Otherwise if they perform an exercise for 45 seconds, start with 15 seconds and rest and then resume with a begin the new activity.    If you work your way up to doing this 12 minute video, I expect you will see significant improvements in your pain.  If you enjoy his videos and would like to find out more you can look on his website: [email or web address].  He has a workout streaming option as well as a DVD set available for purchase.  Amazon has the best price for his DVDs.    =================================================================  Follow-up: Return in about 6 weeks (around 11/06/2016) for repeat diagnostic ultrasound.   CMA/ATC served as Neurosurgeon during this visit. History, Physical, and Plan performed by medical provider. Documentation and orders reviewed and attested to.      Gaspar Bidding, DO    [facility]

## 2016-09-25 NOTE — Procedures (Signed)
LIMITED MSK ULTRASOUND OF Right Shoulder Images were obtained and interpreted by myself, Gaspar Bidding, DO  Images have been saved and stored to PACS system. Images obtained on: GE S7 Ultrasound machine  FINDINGS:  Biceps Tendon: Within the biceps groove tendon is normal but as it transitions into the intra-articular space there is hypoechoic change as well as thickening consistent with tendinosis Pec Major Insertion: Normal Subscapularis Tendon: Small amount of thickening without significant interstitial tearing Supraspinatus Tendon: Posterior fibers of the articular sided partial-thickness tear with overall good approximate sedation with resistance.  There is minimal neovascularity within the tendon neuropathic changes with a small amount of bursal fluid surrounding this but this is minimal. Infraspinatus/Teres Minor Tendon: Slightly thickened but overall normal-appearing AC Joint: Minimal degenerative change JOINT: No significant GH spurring appreciated  LABRUM: Not evaluated   IMPRESSION:  1. Chronic biceps tendinosis 2. Partial-thickness supraspinatus tear with underlying tendinopathy.

## 2016-09-25 NOTE — Assessment & Plan Note (Signed)
Agree with concerns for potential psoriatic arthritis and referral to rheumatology.  She has significant changes of her cervical spine hands cervical, and lumbar spine as well as the aspects of her thoracic spine and imaged.  We appreciate the expertise of our rheumatology partners.

## 2016-09-25 NOTE — Assessment & Plan Note (Addendum)
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  Pain is only mild at this time.  Will defer to orthopedics.

## 2016-09-25 NOTE — Assessment & Plan Note (Addendum)
Patient has marked swelling of her bilateral median nerves.  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  Nerve conduction studies nerve conduction studies and EMGs indicated at this time.  If severe on nerve conduction for median neuropathy I am happy to defer to Dr. Thurston Hole for further management.

## 2016-09-25 NOTE — Assessment & Plan Note (Signed)
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  Further characterization with EMG/nerve conduction studies will be our first workup and can consider MRI pending these results.  Trial of amitriptyline at night given the significant nighttime pain she is having.

## 2016-09-25 NOTE — Procedures (Signed)
LIMITED MSK ULTRASOUND OF bilateral wrists Images were obtained and interpreted by myself, Gaspar Bidding, DO  Images have been saved and stored to PACS system. Images obtained on: GE S7 Ultrasound machine  FINDINGS:   Right median nerve measures 0.27 cm  Left median nerve measures 0.21 cm  Both have significant dumbbell deformities.  IMPRESSION:  1. Bilateral severe carpal tunnel syndrome based on ultrasound findings, correlate with EMG/nerve conduction studies

## 2016-09-26 ENCOUNTER — Encounter: Payer: Self-pay | Admitting: Sports Medicine

## 2016-09-26 DIAGNOSIS — M7541 Impingement syndrome of right shoulder: Secondary | ICD-10-CM | POA: Insufficient documentation

## 2016-09-26 NOTE — Assessment & Plan Note (Signed)
Additionally we will try to change the TCA dosing to see if we can have effect with less side effects.

## 2016-09-26 NOTE — Assessment & Plan Note (Signed)
Fairly significant cervical spondylosis with some mild neural tension signs is concerning likely underlying cervical radiculitis being a component of her for arm pain.  She does have obvious rotator cuff issues however I am reassured that she is recently undergone nerve conduction studies at [facility] to be normal with the exception of bilateral carpal tunnel syndrome which she is undergone surgical release for the right at this time.  Injection previously was helpful with I suspect and recommend consideration of surgical release given the prior significant swelling of the bilateral median nerves.  The left was only 3 mm smaller in diameter than the right at last visit and I suspect that she will require a fairly extensive release similar to the right given the findings on last [facility] ultrasound.

## 2016-09-26 NOTE — Assessment & Plan Note (Signed)
Thickened supraspinatus with interstitial tearing but no full-thickness component appreciated.  Discussed multiple options with her today including injection versus nitroglycerin therapy and she wished to proceed with biologic therapy.  Therapeutic exercises for both the shoulder and neck were provided as below.  Plan for likely 12-16 weeks nitroglycerin therapy will recheck in 6 weeks with repeat exam and if symptoms are significantly improved repeat MSK ultrasound.

## 2016-09-27 DIAGNOSIS — G5603 Carpal tunnel syndrome, bilateral upper limbs: Secondary | ICD-10-CM | POA: Diagnosis not present

## 2016-10-01 DIAGNOSIS — M47812 Spondylosis without myelopathy or radiculopathy, cervical region: Secondary | ICD-10-CM | POA: Diagnosis not present

## 2016-10-01 DIAGNOSIS — M9901 Segmental and somatic dysfunction of cervical region: Secondary | ICD-10-CM | POA: Diagnosis not present

## 2016-10-01 DIAGNOSIS — M9902 Segmental and somatic dysfunction of thoracic region: Secondary | ICD-10-CM | POA: Diagnosis not present

## 2016-10-01 DIAGNOSIS — G441 Vascular headache, not elsewhere classified: Secondary | ICD-10-CM | POA: Diagnosis not present

## 2016-10-15 DIAGNOSIS — M47812 Spondylosis without myelopathy or radiculopathy, cervical region: Secondary | ICD-10-CM | POA: Diagnosis not present

## 2016-10-15 DIAGNOSIS — G441 Vascular headache, not elsewhere classified: Secondary | ICD-10-CM | POA: Diagnosis not present

## 2016-10-15 DIAGNOSIS — M9902 Segmental and somatic dysfunction of thoracic region: Secondary | ICD-10-CM | POA: Diagnosis not present

## 2016-10-15 DIAGNOSIS — M9901 Segmental and somatic dysfunction of cervical region: Secondary | ICD-10-CM | POA: Diagnosis not present

## 2016-10-18 DIAGNOSIS — G5603 Carpal tunnel syndrome, bilateral upper limbs: Secondary | ICD-10-CM | POA: Diagnosis not present

## 2016-10-22 DIAGNOSIS — M47812 Spondylosis without myelopathy or radiculopathy, cervical region: Secondary | ICD-10-CM | POA: Diagnosis not present

## 2016-10-22 DIAGNOSIS — M9902 Segmental and somatic dysfunction of thoracic region: Secondary | ICD-10-CM | POA: Diagnosis not present

## 2016-10-22 DIAGNOSIS — M9901 Segmental and somatic dysfunction of cervical region: Secondary | ICD-10-CM | POA: Diagnosis not present

## 2016-10-22 DIAGNOSIS — G441 Vascular headache, not elsewhere classified: Secondary | ICD-10-CM | POA: Diagnosis not present

## 2016-10-29 DIAGNOSIS — M47812 Spondylosis without myelopathy or radiculopathy, cervical region: Secondary | ICD-10-CM | POA: Diagnosis not present

## 2016-10-29 DIAGNOSIS — M9901 Segmental and somatic dysfunction of cervical region: Secondary | ICD-10-CM | POA: Diagnosis not present

## 2016-10-29 DIAGNOSIS — G441 Vascular headache, not elsewhere classified: Secondary | ICD-10-CM | POA: Diagnosis not present

## 2016-10-29 DIAGNOSIS — M9902 Segmental and somatic dysfunction of thoracic region: Secondary | ICD-10-CM | POA: Diagnosis not present

## 2016-11-06 ENCOUNTER — Ambulatory Visit (INDEPENDENT_AMBULATORY_CARE_PROVIDER_SITE_OTHER): Payer: BLUE CROSS/BLUE SHIELD | Admitting: Sports Medicine

## 2016-11-06 ENCOUNTER — Encounter: Payer: Self-pay | Admitting: Sports Medicine

## 2016-11-06 VITALS — BP 120/82 | HR 68 | Ht <= 58 in | Wt 132.4 lb

## 2016-11-06 DIAGNOSIS — G5603 Carpal tunnel syndrome, bilateral upper limbs: Secondary | ICD-10-CM | POA: Diagnosis not present

## 2016-11-06 DIAGNOSIS — L4050 Arthropathic psoriasis, unspecified: Secondary | ICD-10-CM | POA: Diagnosis not present

## 2016-11-06 DIAGNOSIS — M7541 Impingement syndrome of right shoulder: Secondary | ICD-10-CM

## 2016-11-06 DIAGNOSIS — M4722 Other spondylosis with radiculopathy, cervical region: Secondary | ICD-10-CM

## 2016-11-06 NOTE — Progress Notes (Signed)
OFFICE VISIT NOTE Wendy Davies. [name] at [facility] [phone]  [name] - 56 y.o. female MRN [id number]  Date of birth: 12/21/1960  Visit Date: 11/06/2016  PCP: Helane Rima, DO   Referred by: Helane Rima, DO  [name], CMA acting as scribe for Dr. Berline Chough.  SUBJECTIVE:   Chief Complaint  Patient presents with  . Follow-up    RT shoulder pain   HPI: As below and per problem based documentation when appropriate.  Wendy Davies is an established patient presenting today in follow-up of RT shoulder pain and carpal tunnel syndrome RT hand. She was last seen 09/06/16 and had u/s of the shoulder. She had xray c-spine 07/07/16. She was advised to follow Nitro Protocol and do home therapeutic exercises, foundation training.   Pt has been using Nitro patches with occasional HA, these seems to be decreasing over time. She has been doing home exercises as tolerated since her carpal tunnel surgery. Overall she feels that she shoulder is doing a little better. She no longer has the burning sensation below her scapula that wakes her up at night. She does still feel that the pain is worse at night and she must sleep with a pillow under her shoulder. Pain is rated about 4/10 as apposed to 7/10 which is was before.   Pt reports continued decreased strength after carpal tunnel surgery. She feels that she has residual nerve damage. She has some swelling around the incision site. She has been doing scar remediation with her massage therapist.     Review of Systems  Constitutional: Negative for chills and fever.  Respiratory: Negative.   Cardiovascular: Negative.   Gastrointestinal: Negative.   Genitourinary: Negative.   Musculoskeletal: Positive for joint pain.  Neurological: Positive for headaches. Negative for dizziness and tingling.  Endo/Heme/Allergies: Does not bruise/bleed easily.    Otherwise per HPI.  HISTORY & PERTINENT PRIOR  DATA:  No specialty comments available. She reports that she has never smoked. She has never used smokeless tobacco. No results for input(s): HGBA1C, LABURIC in the last 8760 hours. Allergies reviewed per EMR Prior to Admission medications   Medication Sig Start Date End Date Taking? Authorizing Provider  DIVIGEL 1 MG/GM GEL  07/24/16  Yes [provider]  montelukast (SINGULAIR) 10 MG tablet Take 10 mg by mouth every evening. 04/03/14  Yes [provider]  nitroGLYCERIN (NITRODUR - DOSED IN MG/24 HR) 0.2 mg/hr patch Place 1/4 to 1/2 of a patch over affected region. Remove and replace once daily.  Slightly alter skin placement daily 09/25/16  Yes Andrena Mews, DO   Patient Active Problem List   Diagnosis Date Noted  . Rotator cuff impingement syndrome of right shoulder 09/26/2016  . Bilateral carpal tunnel syndrome 08/28/2016  . Osteoarthritis of spine with radiculopathy, cervical region 08/28/2016  . Chronic right shoulder pain 08/28/2016  . Psoriatic arthritis (HCC) 08/26/2016  . 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 Spasm of cervical paraspinous muscle 08/26/2016  . Joint pain in fingers of right hand 08/26/2016  . 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 History of right hip replacement 08/26/2016  . Joint pain in fingers of left hand 07/26/2016  . Back pain 07/26/2016  . Chronic pain of right hip 07/26/2016  . Gluten intolerance 07/26/2016   No past medical history on file. Family History  Problem Relation Age of Onset  . Diabetes Mother   . Diabetes Brother   .  Hypertension Brother    Past Surgical History:  Procedure Laterality Date  . ABDOMINAL HYSTERECTOMY    . CESAREAN SECTION    . JOINT REPLACEMENT  1997   Rt hip:  [employer]   Social History   Occupational History  . Not on file.   Social History Main Topics  . Smoking status: Never Smoker  . Smokeless tobacco: Never Used  . Alcohol use Yes  . Drug use: No  .  Sexual activity: Yes    OBJECTIVE:  VS:  HT:4\' 10"  (147.3 cm)   WT:132 lb 6.4 oz (60.1 kg)  BMI:27.68    BP:120/82  HR:68bpm  TEMP: ( )  RESP:98 % EXAM: Findings:  Right shoulder overall well aligned and quite muscular.  Only minimal pain across the anterior aspect of the shoulder.  Internal and external rotation strength is intact.  Small amount of pain with empty can testing but strength is intact.  No focal pain over the [facility] joint. Right wrist is normal appearing with a well-healed surgical incision with only a small amount of pain over the ulnar-sided pillar with palpation but this is minimal.  Incision is well-healed.  She does have some pitting of the nails bilaterally.    RADIOLOGY: [place] LIMITED JOINT SPACE STRUCTURES UP RIGHT(NO LINKED CHARGES) Andrena Mews, DO     09/25/2016  9:18 AM LIMITED MSK ULTRASOUND OF Right Shoulder Images were obtained and interpreted by myself, Gaspar Bidding, DO   Images have been saved and stored to PACS system. Images obtained on: GE S7 Ultrasound machine  FINDINGS:  Biceps Tendon: Within the biceps groove tendon is normal but as  it transitions into the intra-articular space there is hypoechoic  change as well as thickening consistent with tendinosis Pec Major Insertion: Normal Subscapularis Tendon: Small amount of thickening without  significant interstitial tearing Supraspinatus Tendon: Posterior fibers of the articular sided  partial-thickness tear with overall good approximate sedation  with resistance.  There is minimal neovascularity within the  tendon neuropathic changes with a small amount of bursal fluid  surrounding this but this is minimal. Infraspinatus/Teres Minor Tendon: Slightly thickened but overall  normal-appearing AC Joint: Minimal degenerative change JOINT: No significant GH spurring appreciated  LABRUM: Not evaluated  IMPRESSION:  1. Chronic biceps tendinosis 2. Partial-thickness supraspinatus tear with  underlying  tendinopathy. [place] LIMITED JOINT SPACE STRUCTURES UP RIGHT(NO LINKED CHARGES) Andrena Mews, DO     09/25/2016  8:40 AM LIMITED MSK ULTRASOUND OF bilateral wrists Images were obtained and interpreted by myself, Gaspar Bidding, DO   Images have been saved and stored to PACS system. Images obtained on: GE S7 Ultrasound machine  FINDINGS:   Right median nerve measures 0.27 cm  Left median nerve measures 0.21 cm  Both have significant dumbbell deformities.  IMPRESSION:  1. Bilateral severe carpal tunnel syndrome based on ultrasound  findings, correlate with EMG/nerve conduction studies  ASSESSMENT & PLAN:     ICD-10-CM   1. Rotator cuff impingement syndrome of right shoulder M75.41   2. Osteoarthritis of spine with radiculopathy, cervical region M47.22   3. Bilateral carpal tunnel syndrome G56.03   4. Psoriatic arthritis (HCC) L40.50    ================================================================= Rotator cuff impingement syndrome of right shoulder She has had some progress with the nitroglycerin protocol but has not been able to diligently perform her exercises due to recovery from her carpal tunnel surgery.  Exercises reinforced again today and recommend continuing with nitroglycerin protocol.  Overall she is happy with  how she has progressed and is looking forward to returning to tennis.  Brief discussion today regarding PRP therapy as a potential treatment option.  Could also consider Tenex in the future.  Psoriatic arthritis (HCC) She does have an upcoming appointment with Dr. Dierdre Forth.  Symptoms are consistent with rheumatoid arthritis with negative serology thus far.  Will defer to his expertise for further evaluation and discussion of medications although she is hesitant to consider Biologics especially in the setting of her sister-in-law having developed leukemia following immunobiologic therapy.  Osteoarthritis of spine with radiculopathy, cervical  region Cervical radiculitis has improved and will have her continue with postural exercises.  Bilateral carpal tunnel syndrome Recent surgery with Dr. Thurston Hole.  Incision is well-healed.  She does have a small amount of pillar pain and I discussed the anticipated course of this.  Will defer to his expertise for further guidance regarding return to tennis. She will likely need the left to be performed at some point in the near future.  She reports only minimal improvement with recent injection. Can consider ultrasound-guided Hydro dissection if she is interested given only moderate disease on NCS/EMGs however suspect she will require release ultimately.   PROCEDURE NOTE: THERAPEUTIC EXERCISES (97110) 15 minutes spent for Therapeutic exercises as below and as referenced in the AVS. This included exercises focusing on stretching, strengthening, with significant focus on eccentric aspects.  Proper technique shown and discussed handout in great detail with ATC. All questions were discussed and answered.   Long term goals include an improvement in range of motion, strength, endurance as well as avoiding reinjury. Frequency of visits is one time as determined during today's  office visit. Frequency of exercises to be performed is as per handout.  EXERCISES REVIEWED:  Scap Stabilization  Intrinsic shoulder exercises    ================================================================= Patient Instructions  Please perform the exercise program that we have prepared for you and gone over in detail on a daily basis.  In addition to the handout you were provided you can access your program through: [email or web address]   Your unique program code is: [email or web address]  =================================================================   Follow-up: Return in about 6 weeks (around 12/18/2016).   CMA/ATC served as Neurosurgeon during this visit. History, Physical, and Plan performed by medical provider.  Documentation and orders reviewed and attested to.      Gaspar Bidding, DO    [facility] Physician

## 2016-11-06 NOTE — Patient Instructions (Signed)
Please perform the exercise program that we have prepared for you and gone over in detail on a daily basis.  In addition to the handout you were provided you can access your program through: [email or web address]   Your unique program code is: [email or web address]

## 2016-11-07 NOTE — Assessment & Plan Note (Signed)
She does have an upcoming appointment with Dr. Dierdre Forth.  Symptoms are consistent with rheumatoid arthritis with negative serology thus far.  Will defer to his expertise for further evaluation and discussion of medications although she is hesitant to consider Biologics especially in the setting of her sister-in-law having developed leukemia following immunobiologic therapy.

## 2016-11-07 NOTE — Assessment & Plan Note (Addendum)
She has had some progress with the nitroglycerin protocol but has not been able to diligently perform her exercises due to recovery from her carpal tunnel surgery.  Exercises reinforced again today and recommend continuing with nitroglycerin protocol.  Overall she is happy with how she has progressed and is looking forward to returning to tennis.  Brief discussion today regarding PRP therapy as a potential treatment option.  Could also consider Tenex in the future.

## 2016-11-07 NOTE — Assessment & Plan Note (Signed)
Cervical radiculitis has improved and will have her continue with postural exercises.

## 2016-11-07 NOTE — Assessment & Plan Note (Signed)
Recent surgery with Dr. Thurston Hole.  Incision is well-healed.  She does have a small amount of pillar pain and I discussed the anticipated course of this.  Will defer to his expertise for further guidance regarding return to tennis. She will likely need the left to be performed at some point in the near future.  She reports only minimal improvement with recent injection. Can consider ultrasound-guided Hydro dissection if she is interested given only moderate disease on NCS/EMGs however suspect she will require release ultimately.

## 2016-11-12 DIAGNOSIS — M47812 Spondylosis without myelopathy or radiculopathy, cervical region: Secondary | ICD-10-CM | POA: Diagnosis not present

## 2016-11-12 DIAGNOSIS — G441 Vascular headache, not elsewhere classified: Secondary | ICD-10-CM | POA: Diagnosis not present

## 2016-11-12 DIAGNOSIS — M9902 Segmental and somatic dysfunction of thoracic region: Secondary | ICD-10-CM | POA: Diagnosis not present

## 2016-11-12 DIAGNOSIS — M9901 Segmental and somatic dysfunction of cervical region: Secondary | ICD-10-CM | POA: Diagnosis not present

## 2016-11-13 DIAGNOSIS — R5383 Other fatigue: Secondary | ICD-10-CM | POA: Diagnosis not present

## 2016-11-13 DIAGNOSIS — L4059 Other psoriatic arthropathy: Secondary | ICD-10-CM | POA: Diagnosis not present

## 2016-11-13 DIAGNOSIS — M2550 Pain in unspecified joint: Secondary | ICD-10-CM | POA: Diagnosis not present

## 2016-11-13 DIAGNOSIS — L409 Psoriasis, unspecified: Secondary | ICD-10-CM | POA: Diagnosis not present

## 2016-11-14 ENCOUNTER — Telehealth: Payer: Self-pay | Admitting: Family Medicine

## 2016-11-14 NOTE — Telephone Encounter (Signed)
ROI faxed to [facility]

## 2016-11-19 DIAGNOSIS — M9901 Segmental and somatic dysfunction of cervical region: Secondary | ICD-10-CM | POA: Diagnosis not present

## 2016-11-19 DIAGNOSIS — G441 Vascular headache, not elsewhere classified: Secondary | ICD-10-CM | POA: Diagnosis not present

## 2016-11-19 DIAGNOSIS — M47812 Spondylosis without myelopathy or radiculopathy, cervical region: Secondary | ICD-10-CM | POA: Diagnosis not present

## 2016-11-19 DIAGNOSIS — M9902 Segmental and somatic dysfunction of thoracic region: Secondary | ICD-10-CM | POA: Diagnosis not present

## 2016-11-21 ENCOUNTER — Telehealth: Payer: Self-pay | Admitting: Family Medicine

## 2016-11-21 NOTE — Telephone Encounter (Signed)
MEDICATION:   montelukast (SINGULAIR) 10 MG tablet     PHARMACY:   [facility] - [place], [place] - [address] [phone] (Phone) [phone] (Fax)     IS THIS A 90 DAY SUPPLY : yes  IS PATIENT OUT OF MEDICATION: no  IF NOT; HOW MUCH IS LEFT: 3  LAST APPOINTMENT DATE: @07 /19/18  NEXT APPOINTMENT DATE:@12 /12/18 OTHER COMMENTS:    **Let patient know to contact pharmacy at the end of the day to make sure medication is ready. **  ** Please notify patient to allow 48-72 hours to process**  **Encourage patient to contact the pharmacy for refills or they can request refills through [facility]**

## 2016-11-22 ENCOUNTER — Other Ambulatory Visit: Payer: Self-pay

## 2016-11-22 MED ORDER — MONTELUKAST SODIUM 10 MG PO TABS: 10.0000 mg | ORAL_TABLET | Freq: Every evening | ORAL | 2 refills | Status: DC

## 2016-11-22 NOTE — Telephone Encounter (Signed)
Refill sent to pharmacy.   

## 2016-11-22 NOTE — Telephone Encounter (Signed)
Okay refill. 

## 2016-11-22 NOTE — Telephone Encounter (Signed)
Please advise on refill.  You have not previously filled.

## 2016-11-26 DIAGNOSIS — M9902 Segmental and somatic dysfunction of thoracic region: Secondary | ICD-10-CM | POA: Diagnosis not present

## 2016-11-26 DIAGNOSIS — M9901 Segmental and somatic dysfunction of cervical region: Secondary | ICD-10-CM | POA: Diagnosis not present

## 2016-11-26 DIAGNOSIS — G441 Vascular headache, not elsewhere classified: Secondary | ICD-10-CM | POA: Diagnosis not present

## 2016-11-26 DIAGNOSIS — M47812 Spondylosis without myelopathy or radiculopathy, cervical region: Secondary | ICD-10-CM | POA: Diagnosis not present

## 2016-11-29 DIAGNOSIS — G5603 Carpal tunnel syndrome, bilateral upper limbs: Secondary | ICD-10-CM | POA: Diagnosis not present

## 2016-12-03 DIAGNOSIS — M9901 Segmental and somatic dysfunction of cervical region: Secondary | ICD-10-CM | POA: Diagnosis not present

## 2016-12-03 DIAGNOSIS — L409 Psoriasis, unspecified: Secondary | ICD-10-CM | POA: Diagnosis not present

## 2016-12-03 DIAGNOSIS — E663 Overweight: Secondary | ICD-10-CM | POA: Diagnosis not present

## 2016-12-03 DIAGNOSIS — L4059 Other psoriatic arthropathy: Secondary | ICD-10-CM | POA: Diagnosis not present

## 2016-12-03 DIAGNOSIS — G441 Vascular headache, not elsewhere classified: Secondary | ICD-10-CM | POA: Diagnosis not present

## 2016-12-03 DIAGNOSIS — M47812 Spondylosis without myelopathy or radiculopathy, cervical region: Secondary | ICD-10-CM | POA: Diagnosis not present

## 2016-12-03 DIAGNOSIS — M9902 Segmental and somatic dysfunction of thoracic region: Secondary | ICD-10-CM | POA: Diagnosis not present

## 2016-12-03 DIAGNOSIS — Z6827 Body mass index (BMI) 27.0-27.9, adult: Secondary | ICD-10-CM | POA: Diagnosis not present

## 2016-12-10 DIAGNOSIS — M9901 Segmental and somatic dysfunction of cervical region: Secondary | ICD-10-CM | POA: Diagnosis not present

## 2016-12-10 DIAGNOSIS — G441 Vascular headache, not elsewhere classified: Secondary | ICD-10-CM | POA: Diagnosis not present

## 2016-12-10 DIAGNOSIS — M9902 Segmental and somatic dysfunction of thoracic region: Secondary | ICD-10-CM | POA: Diagnosis not present

## 2016-12-10 DIAGNOSIS — M47812 Spondylosis without myelopathy or radiculopathy, cervical region: Secondary | ICD-10-CM | POA: Diagnosis not present

## 2016-12-17 DIAGNOSIS — M9901 Segmental and somatic dysfunction of cervical region: Secondary | ICD-10-CM | POA: Diagnosis not present

## 2016-12-17 DIAGNOSIS — M47812 Spondylosis without myelopathy or radiculopathy, cervical region: Secondary | ICD-10-CM | POA: Diagnosis not present

## 2016-12-17 DIAGNOSIS — M9902 Segmental and somatic dysfunction of thoracic region: Secondary | ICD-10-CM | POA: Diagnosis not present

## 2016-12-17 DIAGNOSIS — G441 Vascular headache, not elsewhere classified: Secondary | ICD-10-CM | POA: Diagnosis not present

## 2016-12-18 ENCOUNTER — Ambulatory Visit: Payer: BLUE CROSS/BLUE SHIELD | Admitting: Sports Medicine

## 2016-12-18 ENCOUNTER — Encounter: Payer: Self-pay | Admitting: Sports Medicine

## 2016-12-18 VITALS — BP 126/82 | HR 75 | Ht <= 58 in | Wt 130.2 lb

## 2016-12-18 DIAGNOSIS — L4050 Arthropathic psoriasis, unspecified: Secondary | ICD-10-CM | POA: Diagnosis not present

## 2016-12-18 DIAGNOSIS — M4722 Other spondylosis with radiculopathy, cervical region: Secondary | ICD-10-CM

## 2016-12-18 DIAGNOSIS — G5603 Carpal tunnel syndrome, bilateral upper limbs: Secondary | ICD-10-CM | POA: Diagnosis not present

## 2016-12-18 DIAGNOSIS — M7541 Impingement syndrome of right shoulder: Secondary | ICD-10-CM | POA: Diagnosis not present

## 2016-12-18 NOTE — Progress Notes (Signed)
OFFICE VISIT NOTE Wendy Davies. Wendy Davies Sports Medicine [facility] at [facility] [phone]  Wendy Davies - 56 y.o. female MRN [id number]  Date of birth: Apr 18, 1960  Visit Date: 12/18/2016  PCP: Helane Rima, DO   Referred by: Helane Rima, DO  Stevenson Clinch, CMA acting as scribe for Dr. Berline Chough.  SUBJECTIVE:   Chief Complaint  Patient presents with  . Follow-up  . right shoulder pain  . carpal tunnel, RT hand  . osteoarthritis c-spine   HPI: As below and per problem based documentation when appropriate.  Wendy Davies is an established patient presenting today in follow-up of RT rotator cuff impingement, carpal tunnel RT hand, and osteoarthritis c-spine. She was last seen 11/06/2016 and was provided with home therapeutic exercises. She was seen by Dr. Dierdre Forth at [facility] 11/13/16.   Pt was dx for psoriatic arthritis by rheumatology and prescribed Cosentyx. So far, she has not had any adverse reaction to the medication. She had surgery for carpal tunnel of the RT hand and has surgery scheduled in Dec 2018 for the left hand. She has noticed improvement when trying to tie her shoes, she doesn't tend to drop the laces any more.   She continues to have some pain in the RT shoulder. The pain comes and goes now as to before when it was constant. She has been using [employer] and has gotten some relief. The shoulder will ache at times but she feels like her ROM is OK, worse over head.     Review of Systems  Constitutional: Negative for chills, fever and malaise/fatigue.  Cardiovascular: Positive for palpitations.  Neurological: Positive for weakness.    Otherwise per HPI.  HISTORY & PERTINENT PRIOR DATA:  No specialty comments available. She reports that  has never smoked. she has never used smokeless tobacco. No results for input(s): HGBA1C, LABURIC, CREATINE in the last 8760 hours.  Invalid input(s): CR Allergies reviewed per EMR Prior to  Admission medications   Medication Sig Start Date End Date Taking? Authorizing Provider  DIVIGEL 1 MG/GM GEL  07/24/16  Yes [provider]  montelukast (SINGULAIR) 10 MG tablet Take 1 tablet (10 mg total) by mouth every evening. 11/22/16  Yes Helane Rima, DO  nitroGLYCERIN (NITRODUR - DOSED IN MG/24 HR) 0.2 mg/hr patch Place 1/4 to 1/2 of a patch over affected region. Remove and replace once daily.  Slightly alter skin placement daily 09/25/16  Yes Andrena Mews, DO  Secukinumab (COSENTYX 300 DOSE) 150 MG/ML SOSY Inject 150 mg/mL into the skin. 300 mg once weekly   Yes [provider]   Patient Active Problem List   Diagnosis Date Noted  . Rotator cuff impingement syndrome of right shoulder 09/26/2016  . Bilateral carpal tunnel syndrome 08/28/2016  . Osteoarthritis of spine with radiculopathy, cervical region 08/28/2016  . Chronic right shoulder pain 08/28/2016  . Psoriatic arthritis (HCC) 08/26/2016  . 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 Spasm of cervical paraspinous muscle 08/26/2016  . Joint pain in fingers of right hand 08/26/2016  . 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 History of right hip replacement 08/26/2016  . Joint pain in fingers of left hand 07/26/2016  . Back pain 07/26/2016  . Chronic pain of right hip 07/26/2016  . Gluten intolerance 07/26/2016   No past medical history on file. Family History  Problem Relation Age of Onset  . Diabetes Mother   . Diabetes Brother   .  Hypertension Brother    Past Surgical History:  Procedure Laterality Date  . ABDOMINAL HYSTERECTOMY    . CESAREAN SECTION    . JOINT REPLACEMENT  1997   Rt hip:  [employer]   Social History   Occupational History  . Not on file  Tobacco Use  . Smoking status: Never Smoker  . Smokeless tobacco: Never Used  Substance and Sexual Activity  . Alcohol use: Yes  . Drug use: No  . Sexual activity: Yes    OBJECTIVE:  VS:  HT:4\' 10"  (147.3 cm)    WT:130 lb 3.2 oz (59.1 kg)  BMI:27.22    BP:126/82  HR:75bpm  TEMP: ( )  RESP:98 % EXAM: Findings:  Adult female.  No acute distress.  Alert and appropriate. Right shoulder is overall well aligned.  She has full overhead range of motion of the shoulder.  Only minimal pain with speeds testing and with empty can testing.  She has a small amount of post isometric pain with external rotation but this is mild.  Small amount of crepitation with axial load and circumduction but once again this is mild and nonpainful. Right hand has well-healed surgical incision.  Small amount of pillar pain.  Grip strength is intact.  She has improvement in the thenar musculature on the right. Left hand has small amount of pain across the carpal tunnel but this is mild.    RADIOLOGY: [place] LIMITED JOINT SPACE STRUCTURES UP RIGHT(NO LINKED CHARGES) Andrena Mews, DO     09/25/2016  9:18 AM LIMITED MSK ULTRASOUND OF Right Shoulder Images were obtained and interpreted by myself, Gaspar Bidding, DO   Images have been saved and stored to PACS system. Images obtained on: GE S7 Ultrasound machine  FINDINGS:  Biceps Tendon: Within the biceps groove tendon is normal but as  it transitions into the intra-articular space there is hypoechoic  change as well as thickening consistent with tendinosis Pec Major Insertion: Normal Subscapularis Tendon: Small amount of thickening without  significant interstitial tearing Supraspinatus Tendon: Posterior fibers of the articular sided  partial-thickness tear with overall good approximate sedation  with resistance.  There is minimal neovascularity within the  tendon neuropathic changes with a small amount of bursal fluid  surrounding this but this is minimal. Infraspinatus/Teres Minor Tendon: Slightly thickened but overall  normal-appearing AC Joint: Minimal degenerative change JOINT: No significant GH spurring appreciated  LABRUM: Not evaluated  IMPRESSION:  1. Chronic  biceps tendinosis 2. Partial-thickness supraspinatus tear with underlying  tendinopathy. [place] LIMITED JOINT SPACE STRUCTURES UP RIGHT(NO LINKED CHARGES) Andrena Mews, DO     09/25/2016  8:40 AM LIMITED MSK ULTRASOUND OF bilateral wrists Images were obtained and interpreted by myself, Gaspar Bidding, DO   Images have been saved and stored to PACS system. Images obtained on: GE S7 Ultrasound machine  FINDINGS:   Right median nerve measures 0.27 cm  Left median nerve measures 0.21 cm  Both have significant dumbbell deformities.  IMPRESSION:  1. Bilateral severe carpal tunnel syndrome based on ultrasound  findings, correlate with EMG/nerve conduction studies  ASSESSMENT & PLAN:     ICD-10-CM   1. Osteoarthritis of spine with radiculopathy, cervical region M47.22   2. Rotator cuff impingement syndrome of right shoulder M75.41   3. Bilateral carpal tunnel syndrome G56.03   4. Psoriatic arthritis (HCC) L40.50    ================================================================= Rotator cuff impingement syndrome of right shoulder She is having slow steady improvement with nitroglycerin protocol, therapeutic exercises and return activity.  Symptoms are only intermittent at this time which is an improvement.  We will plan to have her continue with nitroglycerin treatment to both the biceps and into the supraspinatus and will have her try to titrate this if she can tolerate it to a quarter patch in both locations.  Follow-up in 6-8 weeks for repeat ultrasound  Osteoarthritis of spine with radiculopathy, cervical region Seems to be doing quite well from the standpoint.    Bilateral carpal tunnel syndrome Healing well, still having some pillar pain that is slowly resolving.  Having left carpal tunnel release in December. Discussed options for repeating injection versus surgical option and surgical option is warranted  Psoriatic arthritis (HCC) Has been seen by Dr. Dierdre Forth and started  on Cosentyx. Will be monitoring dactylitis   ================================================================= Future Appointments  Date Time Provider Department Center  01/16/2017  7:40 AM Helane Rima, DO [facility] None  02/12/2017  8:20 AM Andrena Mews, DO [facility] None   Follow-up: Return in about 8 weeks (around 02/12/2017).   CMA/ATC served as Neurosurgeon during this visit. History, Physical, and Plan performed by medical provider. Documentation and orders reviewed and attested to.      Gaspar Bidding, DO    [facility] Physician

## 2016-12-19 ENCOUNTER — Encounter: Payer: Self-pay | Admitting: Sports Medicine

## 2016-12-19 NOTE — Assessment & Plan Note (Addendum)
Has been seen by Dr. Dierdre ForthBeekman and started on Cosentyx. Will be monitoring dactylitis

## 2016-12-19 NOTE — Assessment & Plan Note (Signed)
She is having slow steady improvement with nitroglycerin protocol, therapeutic exercises and return activity.  Symptoms are only intermittent at this time which is an improvement.  We will plan to have her continue with nitroglycerin treatment to both the biceps and into the supraspinatus and will have her try to titrate this if she can tolerate it to a quarter patch in both locations.  Follow-up in 6-8 weeks for repeat ultrasound

## 2016-12-19 NOTE — Assessment & Plan Note (Signed)
Seems to be doing quite well from the standpoint.

## 2016-12-19 NOTE — Assessment & Plan Note (Signed)
Healing well, still having some pillar pain that is slowly resolving.  Having left carpal tunnel release in December. Discussed options for repeating injection versus surgical option and surgical option is warranted

## 2016-12-24 DIAGNOSIS — G441 Vascular headache, not elsewhere classified: Secondary | ICD-10-CM | POA: Diagnosis not present

## 2016-12-24 DIAGNOSIS — M9901 Segmental and somatic dysfunction of cervical region: Secondary | ICD-10-CM | POA: Diagnosis not present

## 2016-12-24 DIAGNOSIS — M9902 Segmental and somatic dysfunction of thoracic region: Secondary | ICD-10-CM | POA: Diagnosis not present

## 2016-12-24 DIAGNOSIS — M47812 Spondylosis without myelopathy or radiculopathy, cervical region: Secondary | ICD-10-CM | POA: Diagnosis not present

## 2016-12-31 DIAGNOSIS — M47812 Spondylosis without myelopathy or radiculopathy, cervical region: Secondary | ICD-10-CM | POA: Diagnosis not present

## 2016-12-31 DIAGNOSIS — M9901 Segmental and somatic dysfunction of cervical region: Secondary | ICD-10-CM | POA: Diagnosis not present

## 2016-12-31 DIAGNOSIS — M9902 Segmental and somatic dysfunction of thoracic region: Secondary | ICD-10-CM | POA: Diagnosis not present

## 2016-12-31 DIAGNOSIS — G441 Vascular headache, not elsewhere classified: Secondary | ICD-10-CM | POA: Diagnosis not present

## 2017-01-16 ENCOUNTER — Encounter: Payer: Self-pay | Admitting: Family Medicine

## 2017-01-17 ENCOUNTER — Ambulatory Visit (INDEPENDENT_AMBULATORY_CARE_PROVIDER_SITE_OTHER): Payer: BLUE CROSS/BLUE SHIELD | Admitting: Family Medicine

## 2017-01-17 ENCOUNTER — Encounter: Payer: Self-pay | Admitting: Family Medicine

## 2017-01-17 VITALS — Ht <= 58 in | Wt 131.0 lb

## 2017-01-17 DIAGNOSIS — Z0000 Encounter for general adult medical examination without abnormal findings: Secondary | ICD-10-CM

## 2017-01-17 DIAGNOSIS — E2839 Other primary ovarian failure: Secondary | ICD-10-CM | POA: Diagnosis not present

## 2017-01-17 DIAGNOSIS — Z13220 Encounter for screening for lipoid disorders: Secondary | ICD-10-CM | POA: Diagnosis not present

## 2017-01-18 DIAGNOSIS — G5602 Carpal tunnel syndrome, left upper limb: Secondary | ICD-10-CM | POA: Diagnosis not present

## 2017-01-20 NOTE — Progress Notes (Signed)
Subjective:    Lexandra Haseman is a 56 y.o. female and is here for a comprehensive physical exam.  Pertinent Gynecological History: No LMP recorded. Patient has had a hysterectomy.  Health Maintenance Due  Topic Date Due  . Hepatitis C Screening  [birth year] 1960  . HIV Screening  12/07/1974  . TETANUS/TDAP  12/07/1978  . COLONOSCOPY  12/06/2009    PMHx, SurgHx, SocialHx, Medications, and Allergies were reviewed in the Visit Navigator and updated as appropriate.   History reviewed. No pertinent past medical history.  Past Surgical History:  Procedure Laterality Date  . ABDOMINAL HYSTERECTOMY    . CESAREAN SECTION    . JOINT REPLACEMENT  1997   Rt hip:  [employer]    Family History  Problem Relation Age of Onset  . Diabetes Mother   . Diabetes Brother   . Hypertension Brother    Social History   Tobacco Use  . Smoking status: Never Smoker  . Smokeless tobacco: Never Used  Substance Use Topics  . Alcohol use: Yes  . Drug use: No    Review of Systems:   Pertinent items are noted in the HPI. Otherwise, ROS is negative.  Objective:   Ht 4\' 10"  (1.473 m)   Wt 131 lb (59.4 kg)   BMI 27.38 kg/m    Wt Readings from Last 3 Encounters:  01/17/17 131 lb (59.4 kg)  12/18/16 130 lb 3.2 oz (59.1 kg)  11/06/16 132 lb 6.4 oz (60.1 kg)     Ht Readings from Last 3 Encounters:  01/17/17 4\' 10"  (1.473 m)  12/18/16 4\' 10"  (1.473 m)  11/06/16 4\' 10"  (1.473 m)   General appearance: alert, cooperative and appears stated age. Head: normocephalic, without obvious abnormality, atraumatic. Neck: no adenopathy, supple, symmetrical, trachea midline; thyroid not enlarged, symmetric, no tenderness/mass/nodules. Lungs: clear to auscultation bilaterally. Heart: regular rate and rhythm Abdomen: soft, non-tender; no masses,  no organomegaly. Extremities: extremities normal, atraumatic, no cyanosis or edema. Skin: skin color, texture, turgor normal, no rashes or lesions. Lymph:  cervical, supraclavicular, and axillary nodes normal; no abnormal inguinal nodes palpated. Neurologic: grossly normal.  Assessment/Plan:   Diagnoses and all orders for this visit:  Routine physical examination  Screening for lipid disorders -     Lipid panel; Future  Estrogen deficiency -     DG Bone Density; Future    Patient Counseling: [x]    Nutrition: Stressed importance of moderation in sodium/caffeine intake, saturated fat and cholesterol, caloric balance, sufficient intake of fresh fruits, vegetables, fiber, calcium, iron, and 1 mg of folate supplement per day (for females capable of pregnancy).  [x]    Stressed the importance of regular exercise.   [x]    Substance Abuse: Discussed cessation/primary prevention of tobacco, alcohol, or other drug use; driving or other dangerous activities under the influence; availability of treatment for abuse.   [x]    Injury prevention: Discussed safety belts, safety helmets, smoke detector, smoking near bedding or upholstery.   [x]    Sexuality: Discussed sexually transmitted diseases, partner selection, use of condoms, avoidance of unintended pregnancy  and contraceptive alternatives.  [x]    Dental health: Discussed importance of regular tooth brushing, flossing, and dental visits.  [x]    Health maintenance and immunizations reviewed. Please refer to Health maintenance section.   Helane Rima, DO [facility]

## 2017-01-21 DIAGNOSIS — G441 Vascular headache, not elsewhere classified: Secondary | ICD-10-CM | POA: Diagnosis not present

## 2017-01-21 DIAGNOSIS — M47812 Spondylosis without myelopathy or radiculopathy, cervical region: Secondary | ICD-10-CM | POA: Diagnosis not present

## 2017-01-21 DIAGNOSIS — M9901 Segmental and somatic dysfunction of cervical region: Secondary | ICD-10-CM | POA: Diagnosis not present

## 2017-01-21 DIAGNOSIS — M9902 Segmental and somatic dysfunction of thoracic region: Secondary | ICD-10-CM | POA: Diagnosis not present

## 2017-01-24 DIAGNOSIS — G5602 Carpal tunnel syndrome, left upper limb: Secondary | ICD-10-CM | POA: Diagnosis not present

## 2017-01-31 ENCOUNTER — Ambulatory Visit (INDEPENDENT_AMBULATORY_CARE_PROVIDER_SITE_OTHER)
Admission: RE | Admit: 2017-01-31 | Discharge: 2017-01-31 | Disposition: A | Discharge status: Home / Self Care | Payer: BLUE CROSS/BLUE SHIELD | Source: Ambulatory Visit | Attending: Family Medicine | Admitting: Family Medicine

## 2017-01-31 ENCOUNTER — Ambulatory Visit: Payer: BLUE CROSS/BLUE SHIELD

## 2017-01-31 ENCOUNTER — Other Ambulatory Visit: Payer: BLUE CROSS/BLUE SHIELD

## 2017-01-31 DIAGNOSIS — E2839 Other primary ovarian failure: Secondary | ICD-10-CM

## 2017-01-31 DIAGNOSIS — G5603 Carpal tunnel syndrome, bilateral upper limbs: Secondary | ICD-10-CM | POA: Diagnosis not present

## 2017-02-01 DIAGNOSIS — L409 Psoriasis, unspecified: Secondary | ICD-10-CM | POA: Diagnosis not present

## 2017-02-01 DIAGNOSIS — L4059 Other psoriatic arthropathy: Secondary | ICD-10-CM | POA: Diagnosis not present

## 2017-02-03 DIAGNOSIS — E2839 Other primary ovarian failure: Secondary | ICD-10-CM | POA: Diagnosis not present

## 2017-02-04 DIAGNOSIS — M9902 Segmental and somatic dysfunction of thoracic region: Secondary | ICD-10-CM | POA: Diagnosis not present

## 2017-02-04 DIAGNOSIS — M47812 Spondylosis without myelopathy or radiculopathy, cervical region: Secondary | ICD-10-CM | POA: Diagnosis not present

## 2017-02-04 DIAGNOSIS — M9901 Segmental and somatic dysfunction of cervical region: Secondary | ICD-10-CM | POA: Diagnosis not present

## 2017-02-04 DIAGNOSIS — G441 Vascular headache, not elsewhere classified: Secondary | ICD-10-CM | POA: Diagnosis not present

## 2017-02-07 ENCOUNTER — Encounter: Payer: Self-pay | Admitting: Physical Therapy

## 2017-02-12 ENCOUNTER — Ambulatory Visit: Payer: BLUE CROSS/BLUE SHIELD | Admitting: Sports Medicine

## 2017-03-01 ENCOUNTER — Ambulatory Visit: Payer: BLUE CROSS/BLUE SHIELD | Admitting: Sports Medicine

## 2017-03-07 ENCOUNTER — Telehealth: Payer: Self-pay | Admitting: Family Medicine

## 2017-03-07 ENCOUNTER — Other Ambulatory Visit: Payer: Self-pay

## 2017-03-07 MED ORDER — MONTELUKAST SODIUM 10 MG PO TABS: 10.0000 mg | ORAL_TABLET | Freq: Every evening | ORAL | 2 refills | Status: DC

## 2017-03-07 NOTE — Telephone Encounter (Signed)
Copied from CRM [phone]. Topic: Quick Communication - Rx Refill/Question >> Mar 07, 2017  8:48 AM Eston Mouldavis, Maicol Bowland B wrote: PT is requesting a 90 day supply with refills  if possible  Medication: montelukast (SINGULAIR) 10 MG tablet   Has the patient contacted their pharmacy?no  she wanted to talk to office to request 90 day supply   (Agent: If no, request that the patient contact the pharmacy for the refill.)   Preferred Pharmacy (with phone number or street name):[facility] - [place], [place] - [address] [phone] (Phone) [phone] (Fax)     Agent: Please be advised that RX refills may take up to 3 business days. We ask that you follow-up with your pharmacy.

## 2017-03-25 ENCOUNTER — Encounter: Payer: Self-pay | Admitting: Sports Medicine

## 2017-03-25 ENCOUNTER — Ambulatory Visit: Payer: BLUE CROSS/BLUE SHIELD | Admitting: Sports Medicine

## 2017-03-25 ENCOUNTER — Ambulatory Visit: Payer: Self-pay

## 2017-03-25 VITALS — BP 130/84 | HR 77 | Ht <= 58 in | Wt 135.0 lb

## 2017-03-25 DIAGNOSIS — L4050 Arthropathic psoriasis, unspecified: Secondary | ICD-10-CM

## 2017-03-25 DIAGNOSIS — G5603 Carpal tunnel syndrome, bilateral upper limbs: Secondary | ICD-10-CM | POA: Diagnosis not present

## 2017-03-25 DIAGNOSIS — M75101 Unspecified rotator cuff tear or rupture of right shoulder, not specified as traumatic: Secondary | ICD-10-CM | POA: Diagnosis not present

## 2017-03-25 DIAGNOSIS — M180 Bilateral primary osteoarthritis of first carpometacarpal joints: Secondary | ICD-10-CM | POA: Diagnosis not present

## 2017-03-25 DIAGNOSIS — M25511 Pain in right shoulder: Secondary | ICD-10-CM | POA: Diagnosis not present

## 2017-03-25 MED ORDER — CELECOXIB 200 MG PO CAPS: ORAL_CAPSULE | ORAL | 1 refills | Status: DC

## 2017-03-25 NOTE — Progress Notes (Signed)
Veverly FellsMichael D. Delorise Shinerigby, DO  [facility] [place] [phone]  Wendy BlacksmithLori Rozzell - 57 y.o. female MRN [id number]  Date of birth: 11/07/1960  Visit Date: 03/25/2017  PCP: Helane RimaWallace, Erica, DO   Referred by: Helane RimaWallace, Erica, DO   Scribe for today's visit: Stevenson ClinchBrandy Coleman, CMA     SUBJECTIVE:  Wendy Davies is here for Follow-up (wrist and shoulder pain) .   RT shoulder pain Compared to the last office visit, her previously described symptoms are improving, the shoulder feels stronger and ROM has improved.  Current symptoms are mild & are nonradiating She has been following Nitro Protocol and doing home exercises. She has also started working with a Systems analystpersonal trainer and she feels that this has been beneficial.    Bilateral wrist pain Compared to the last office visit, her previously described symptoms are improving, still has some numbness in fingers on RT hand. LT wrist isn't doing as good but she does feel like the strength is coming back. She feels a pulling sensation around the LT thumb. She continues to have bruising in the palm of the LT hand.  Current symptoms are moderate & are radiating to fingers She has been wearing wrist guards and icing the LT wrist.    ROS Reports night time disturbances. Denies fevers, chills, or night sweats. Denies unexplained weight loss. Denies personal history of cancer. Denies changes in bowel or bladder habits. Denies recent unreported falls. Denies new or worsening dyspnea or wheezing. Denies headaches or dizziness.  Reports numbness, tingling or weakness  In the extremities.  Denies dizziness or presyncopal episodes Denies lower extremity edema     HISTORY & PERTINENT PRIOR DATA:  Prior History reviewed and updated per electronic medical record.  Significant/pertinent history, findings, studies include:  reports that she has never smoked. She has never used smokeless tobacco. No results for input(s): HGBA1C,  LABURIC, CREATINE in the last 8760 hours. No specialty comments available. No problems updated.  OBJECTIVE:  VS:  HT:4\' 10"  (147.3 cm)   WT:135 lb (61.2 kg)  BMI:28.22    BP:130/84  HR:77bpm  TEMP: ( )  RESP:98 %   PHYSICAL EXAM: Constitutional: WDWN, Non-toxic appearing. Psychiatric: Alert & appropriately interactive.  Not depressed or anxious appearing. Respiratory: No increased work of breathing.  Trachea Midline Eyes: Pupils are equal.  EOM intact without nystagmus.  No scleral icterus  Vascular Exam: warm to touch no edema  upper extremity neuro exam: unremarkable normal strength normal sensation normal reflexes  MSK Exam: Right shoulder has overall improved overhead range of motion.  Still a small amount of pain with empty can testing and mild amount of scapular dyskinesis is persistently present  Small amount of pain over the [facility] joint but this is minimal.  This does cause some crepitation though with axial load and circumduction  Generalized swelling across the bilateral thumbs with small amount of synovitis appreciated.   ASSESSMENT & PLAN:   1. Right shoulder pain, unspecified chronicity   2. Rotator cuff syndrome of right shoulder   3. Arthritis of carpometacarpal (CMC) joints of both thumbs   4. Bilateral carpal tunnel syndrome   5. Psoriatic arthritis (HCC)     PLAN: Right shoulder pain is consistent with subacromial bursitis and slight rotator cuff tendinopathy that has improved.  She should continue with therapeutic exercises.  Topical nitroglycerin can be continued as she is showing some improvements.  Thumb arthritis is present on MSK ultrasound with a small amount of  tenosynovitis appreciated. Follow-up: Return in about 6 weeks (around 05/06/2017).      Please see additional documentation for Objective, Assessment and Plan sections. Pertinent additional documentation may be included in corresponding procedure notes, imaging studies, problem based  documentation and patient instructions. Please see these sections of the encounter for additional information regarding this visit.  CMA/ATC served as Neurosurgeon during this visit. History, Physical, and Plan performed by medical provider. Documentation and orders reviewed and attested to.      Andrena Mews, DO    [facility]

## 2017-05-02 DIAGNOSIS — M2550 Pain in unspecified joint: Secondary | ICD-10-CM | POA: Diagnosis not present

## 2017-05-02 DIAGNOSIS — L409 Psoriasis, unspecified: Secondary | ICD-10-CM | POA: Diagnosis not present

## 2017-05-02 DIAGNOSIS — L4059 Other psoriatic arthropathy: Secondary | ICD-10-CM | POA: Diagnosis not present

## 2017-05-05 NOTE — Procedures (Signed)
LIMITED MSK ULTRASOUND OF Right shoulder Images were obtained and interpreted by myself, Gaspar BiddingMichael Jachin Coury, DO  Images have been saved and stored to PACS system. Images obtained on: GE S7 Ultrasound machine  FINDINGS:   Biceps tendon is normal appearing.  Subacromial bursa is slightly enlarged with thickening of the supraspinatus that is improved compared in the past.  Minimal neovascularity.  Otherwise rotator cuff musculature is unremarkable.  AC joint arthropathy is present with positive mushroom sign but minimal increased neovascularity.  Limited views of the thumb were obtained that did show osteophytic spurring with increased tenosynovitis.  There is increased neovascularity is well.   IMPRESSION:  1. Right subacromial bursitis, minimal 2. Supraspinatus tendinopathy 3. AC joint arthropathy 4. CMC arthritis with mild generalized tenosynovitis

## 2017-05-09 ENCOUNTER — Encounter: Payer: Self-pay | Admitting: Sports Medicine

## 2017-05-09 ENCOUNTER — Ambulatory Visit: Payer: BLUE CROSS/BLUE SHIELD | Admitting: Sports Medicine

## 2017-05-09 VITALS — BP 130/88 | HR 91 | Ht <= 58 in | Wt 136.6 lb

## 2017-05-09 DIAGNOSIS — M180 Bilateral primary osteoarthritis of first carpometacarpal joints: Secondary | ICD-10-CM

## 2017-05-09 DIAGNOSIS — M75101 Unspecified rotator cuff tear or rupture of right shoulder, not specified as traumatic: Secondary | ICD-10-CM | POA: Diagnosis not present

## 2017-05-09 DIAGNOSIS — L4050 Arthropathic psoriasis, unspecified: Secondary | ICD-10-CM | POA: Diagnosis not present

## 2017-05-09 DIAGNOSIS — M25511 Pain in right shoulder: Secondary | ICD-10-CM

## 2017-05-09 DIAGNOSIS — G5603 Carpal tunnel syndrome, bilateral upper limbs: Secondary | ICD-10-CM | POA: Diagnosis not present

## 2017-05-09 DIAGNOSIS — M7541 Impingement syndrome of right shoulder: Secondary | ICD-10-CM

## 2017-05-09 MED ORDER — NITROGLYCERIN 0.2 MG/HR TD PT24: MEDICATED_PATCH | TRANSDERMAL | 1 refills | Status: DC

## 2017-05-09 NOTE — Progress Notes (Signed)
Wendy FellsMichael D. Wendy Shinerigby, DO  [facility] [place] [phone]  Wendy BlacksmithLori Davies - 57 y.o. female MRN [id number]  Date of birth: 05/16/1960  Visit Date: 05/09/2017  PCP: Helane RimaWallace, Erica, DO   Referred by: Helane RimaWallace, Erica, DO  Scribe for today's visit: Wendy FabianMolly Davies, LAT, ATC     SUBJECTIVE:  Wendy BlacksmithLori Davies is here for Follow-up (R shoulder and B wrist pain) .   Info from last OV on 03/25/17: RT shoulder pain Compared to the last office visit, her previously described symptoms are improving, the shoulder feels stronger and ROM has improved.  Current symptoms are mild & are nonradiating She has been following Nitro Protocol and doing home exercises. She has also started working with a Systems analystpersonal trainer and she feels that this has been beneficial.    Bilateral wrist pain Compared to the last office visit, her previously described symptoms are improving, still has some numbness in fingers on RT hand. LT wrist isn't doing as good but she does feel like the strength is coming back. She feels a pulling sensation around the LT thumb. She continues to have bruising in the palm of the LT hand.  Current symptoms are moderate & are radiating to fingers She has been wearing wrist guards and icing the LT wrist.   05/09/17: R Shoulder Pain- Compared to the last office visit on 03/25/17, her previously described R shoulder pain symptoms are improving with noted 70% improvement Current symptoms are moderate & are nonradiating She has been using her nitroglycerin patches and feels they are helping.  She has also started working w/ a Systems analystpersonal trainer 2x/week.   B wrist pain -  Compared to the last office visit, her previously described B wrist pain symptoms are improving w/ noted 50% improvement.  Still has numbness on the tips of the R fingers 1-3.  Has gotten some increased muscle mass along the R thenar eminence. Current symptoms are moderate & are radiating to B fingers She  has been wearing her B wrist splints and icing prn.  Rhematologist is having her do a burst/taper of prednisone.  ROS Denies night time disturbances. Denies fevers, chills, or night sweats. Denies unexplained weight loss. Denies personal history of cancer. Denies changes in bowel or bladder habits. Denies recent unreported falls. Denies new or worsening dyspnea or wheezing. Denies headaches or dizziness.  Reports numbness, tingling or weakness  In the extremities - R fingers 1-3 Denies dizziness or presyncopal episodes Denies lower extremity edema    HISTORY & PERTINENT PRIOR DATA:  Prior History reviewed and updated per electronic medical record.  Significant/pertinent history, findings, studies include:  reports that she has never smoked. She has never used smokeless tobacco. No results for input(s): HGBA1C, LABURIC, CREATINE in the last 8760 hours. No specialty comments available. No problems updated.  OBJECTIVE:  VS:  HT:4\' 10"  (147.3 cm)   WT:136 lb 9.6 oz (62 kg)  BMI:28.56    BP:130/88  HR:91bpm  TEMP: ( )  RESP:97 %   PHYSICAL EXAM: Constitutional: WDWN, Non-toxic appearing. Psychiatric: Alert & appropriately interactive.  Not depressed or anxious appearing. Respiratory: No increased work of breathing.  Trachea Midline Eyes: Pupils are equal.  EOM intact without nystagmus.  No scleral icterus  Vascular Exam: warm to touch no edema  upper and lower extremity neuro exam: unremarkable  MSK Exam: Right shoulder has a small amount of pain still with resisted rotator cuff testing especially and can testing but strength is markedly  improved she has markedly improved range of motion.  Her CMC joints are mildly tender to palpation but this is minimal.  She has well-healed postsurgical incisions of bilateral carpal tunnel surgeries with a small amount of pillar pain is persistent this is mild.  Her psoriatic changes are under good control.   ASSESSMENT & PLAN:   1.  Right shoulder pain, unspecified chronicity   2. Rotator cuff syndrome of right shoulder   3. Arthritis of carpometacarpal (CMC) joints of both thumbs   4. Bilateral carpal tunnel syndrome   5. Psoriatic arthritis (HCC)   6. Rotator cuff impingement syndrome of right shoulder     PLAN: Overall she is regressed quite well.  She is continuing with multimodal/multidisciplinary treatment options including with rheumatology, chiropractic, massage and is remaining active and CrossFit.  Recommend she continue doing so and avoid significant exacerbating activities including overhead lifting with her shoulder but given the progress that she has had with the nitroglycerin we will have her continue with this and if any worsening symptoms can consider further diagnostic evaluation with MRI and consideration of surgery but does not appear this is a full-thickness rotator cuff tear.  She should respond well to the prednisone burst that she is on while adjusting medicines as followed by rheumatology.  Will defer to their expertise and have asked her back at any point we will plan to check in with her in 6 months either way.  Follow-up: Return in about 6 months (around 11/08/2017).      Please see additional documentation for Objective, Assessment and Plan sections. Pertinent additional documentation may be included in corresponding procedure notes, imaging studies, problem based documentation and patient instructions. Please see these sections of the encounter for additional information regarding this visit.  CMA/ATC served as Neurosurgeon during this visit. History, Physical, and Plan performed by medical provider. Documentation and orders reviewed and attested to.      Wendy Mews, DO    [facility]

## 2017-06-13 ENCOUNTER — Encounter: Payer: Self-pay | Admitting: Sports Medicine

## 2017-07-05 ENCOUNTER — Ambulatory Visit: Payer: BLUE CROSS/BLUE SHIELD | Admitting: Sports Medicine

## 2017-07-05 ENCOUNTER — Encounter: Payer: Self-pay | Admitting: Sports Medicine

## 2017-07-05 ENCOUNTER — Ambulatory Visit: Payer: Self-pay

## 2017-07-05 VITALS — BP 136/86 | HR 74 | Ht <= 58 in | Wt 132.6 lb

## 2017-07-05 DIAGNOSIS — R2231 Localized swelling, mass and lump, right upper limb: Secondary | ICD-10-CM

## 2017-07-05 DIAGNOSIS — L4050 Arthropathic psoriasis, unspecified: Secondary | ICD-10-CM

## 2017-07-05 MED ORDER — DICLOFENAC SODIUM 2 % TD SOLN: 1.0000 "application " | Freq: Two times a day (BID) | TRANSDERMAL | 0 refills | Status: AC

## 2017-07-05 MED ORDER — DICLOFENAC SODIUM 2 % TD SOLN: 1.0000 "application " | Freq: Two times a day (BID) | TRANSDERMAL | 2 refills | Status: DC

## 2017-07-05 NOTE — Progress Notes (Signed)
Wendy Davies. [facility] [phone]  Wendy Davies - 56 y.o. female MRN [id number]  Date of birth: 22-Oct-1960  Visit Date: 07/05/2017  PCP: Helane Rima, DO   Referred by: Helane Rima, DO  Scribe for today's visit: Christoper Fabian, LAT, ATC     SUBJECTIVE:  Wendy Davies is here for Follow-up (hand nodules) .   Her R hand nodule symptoms INITIALLY: Began about 6-7 weeks ago w/ no MOI.  Has nodules in the palm of the middle of her R hand in line w/ the 3rd and 4th fingers Described as mild-moderate, throbbing pain, radiating to the R wrist Worsened with playing tennis and golf, gripping activities and weight lifting Improved with nothing noted Additional associated symptoms include: N/T into R fingers 1-4, swelling and nodule x 2 in the R palm    At this time symptoms are worsening compared to onset w/ increased pain She has been trying manual pressure/massage.  She con't to wear her night splints for her wrists and that does help her hand pain.  ROS Reports night time disturbances. Denies fevers, chills, or night sweats. Denies unexplained weight loss. Denies personal history of cancer. Denies changes in bowel or bladder habits. Denies recent unreported falls. Denies new or worsening dyspnea or wheezing. Denies headaches or dizziness.  Reports numbness, tingling or weakness  In the extremities - in the R hand fingers 1-4 Denies dizziness or presyncopal episodes Denies lower extremity edema    HISTORY & PERTINENT PRIOR DATA:  Prior History reviewed and updated per electronic medical record.  Significant/pertinent history, findings, studies include:  reports that she has never smoked. She has never used smokeless tobacco. No results for input(s): HGBA1C, LABURIC, CREATINE in the last 8760 hours. The 10-year ASCVD risk score Denman George DC Montez Hageman., et al., 2013) is: 2.1%*   Values used to calculate the score:     Age: 88  years     Sex: Female     Is Non-Hispanic African American: No     Diabetic: No     Tobacco smoker: No     Systolic Blood Pressure: 136 mmHg     Is BP treated: No     HDL Cholesterol: 90 mg/dL*     Total Cholesterol: 239 mg/dL*     * - Cholesterol units were assumed for this score calculation No problems updated.  OBJECTIVE:  VS:  HT:4\' 10"  (147.3 cm)   WT:132 lb 9.6 oz (60.1 kg)  BMI:27.72    BP:136/86  HR:74bpm  TEMP: ( )  RESP:98 %   PHYSICAL EXAM: Constitutional: WDWN, Non-toxic appearing. Psychiatric: Alert & appropriately interactive.  Not depressed or anxious appearing. Respiratory: No increased work of breathing.  Trachea Midline Eyes: Pupils are equal.  EOM intact without nystagmus.  No scleral icterus  Vascular Exam: warm to touch no edema  upper extremity neuro exam: unremarkable normal strength normal sensation  MSK Exam: Right hand has moderate degree of swelling across the palmar aspect of the right third finger.  There is a moderate degree of tenderness over this area.  She has full flexion extension.  There is no triggering appreciated.   ASSESSMENT & PLAN:   1. Nodule of finger of right hand   2. 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  Topical anti-inflammatories  discussed.  If any worsening features can consider injection.  Follow-up: Return if symptoms worsen or fail to improve.       Please see additional documentation for Objective, Assessment and Plan sections. Pertinent additional documentation may be included in corresponding procedure notes, imaging studies, problem based documentation and patient instructions. Please see these sections of the encounter for additional information regarding this visit.  CMA/ATC served as Neurosurgeon during this visit. History, Physical, and Plan  performed by medical provider. Documentation and orders reviewed and attested to.      Andrena Mews, DO    [facility]

## 2017-07-05 NOTE — Patient Instructions (Signed)
[facility] instructions for [facility], [place] and [place]:  Your prescription will be filled through a mail order pharmacy.  It is typically [facility] but may vary depending on where you live.  You will receive a phone call from them which will typically come from a [phone] phone number.  You must speak directly to them to have this medication filled.  When the pharmacy calls, they will need your mailing address (for overnight shipment of the medication) andy they will need payment information if you have a copay (typically no more than $10). If you have not heard from them 2-3 days after your appointment with Dr. Rigby, contact us at the office ([phone]) or through MyChart so we can reach back out to the pharmacy.  

## 2017-07-09 IMAGING — DX DG HIP (WITH OR WITHOUT PELVIS) 5+V BILAT
5 series · 5 of 5 positions shown · non-contrast
Comparison: None.

CLINICAL DATA: Chronic pain

EXAM:
DG HIP (WITH OR WITHOUT PELVIS) 5+V BILAT

[pelvis ap]
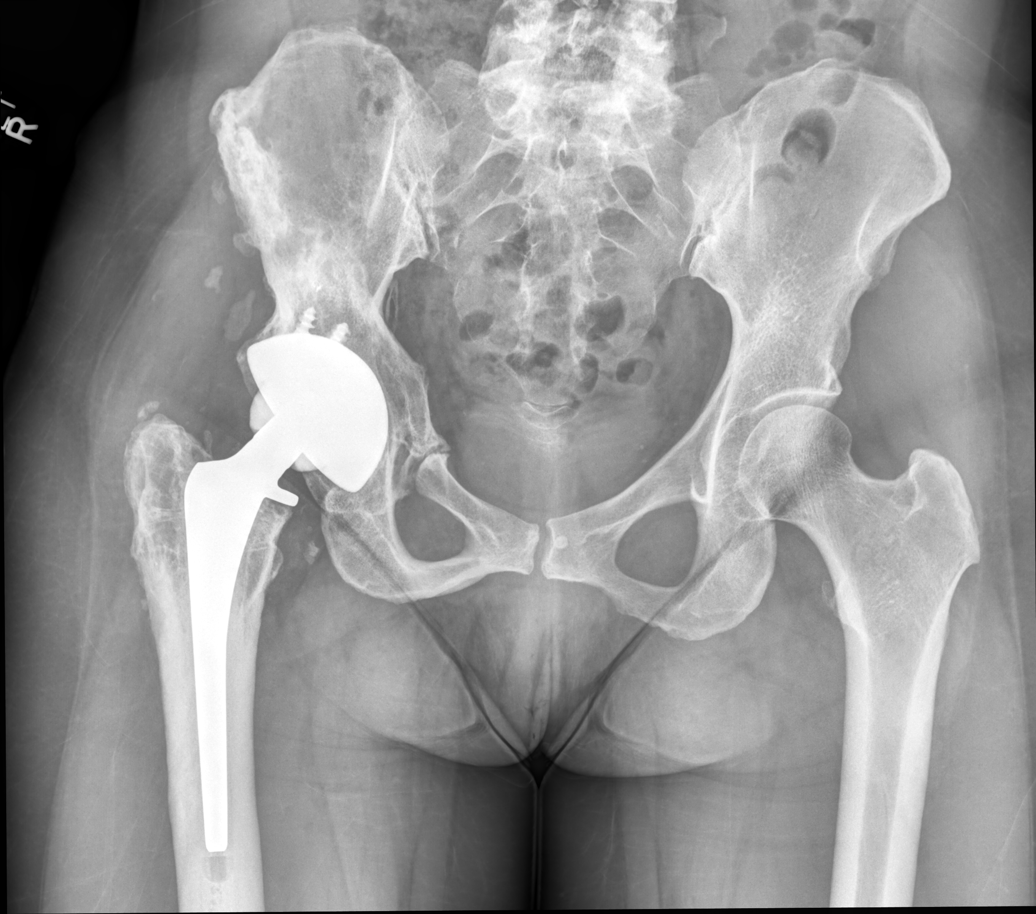

[hip joint ap (1 of 2)]
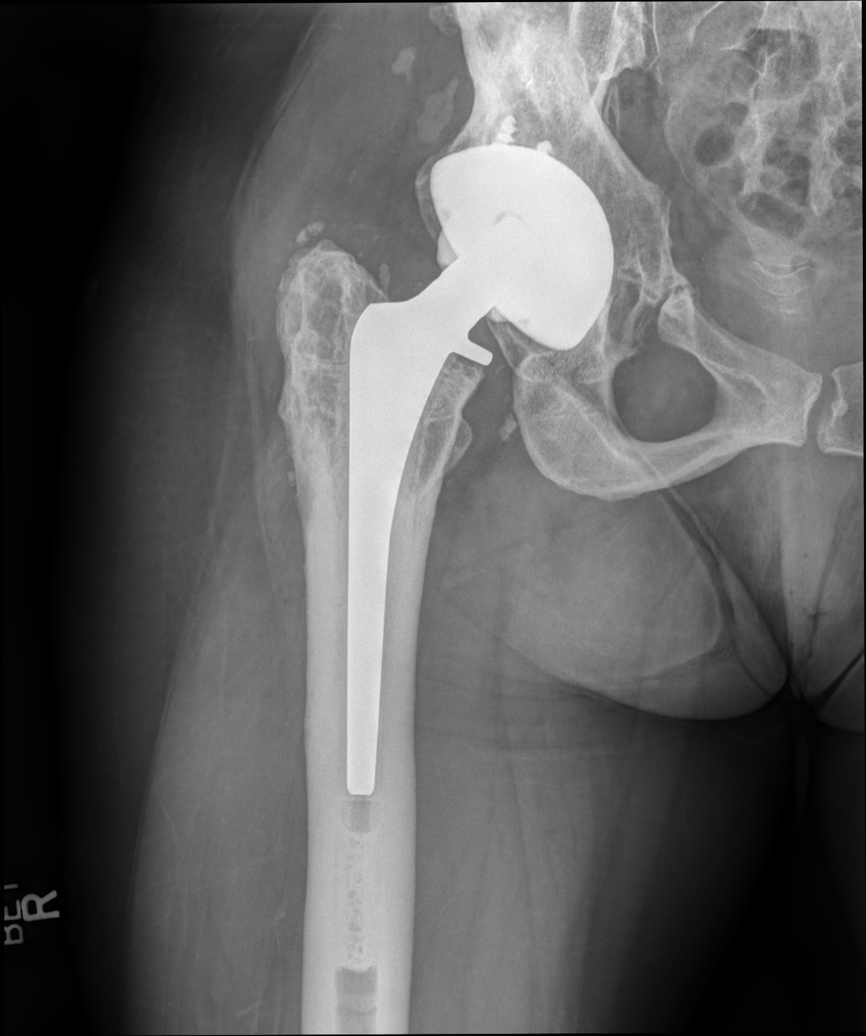

[hip joint ap (2 of 2)]
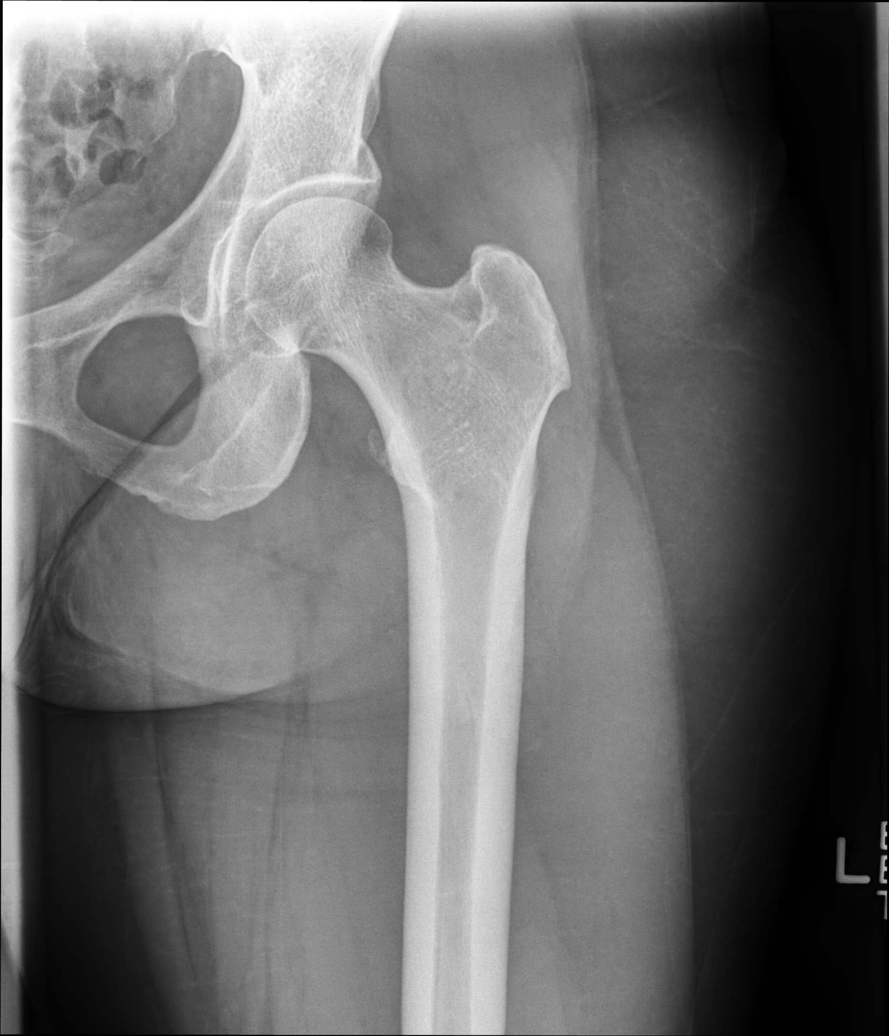

[hip joint (frog view) (1 of 2)]
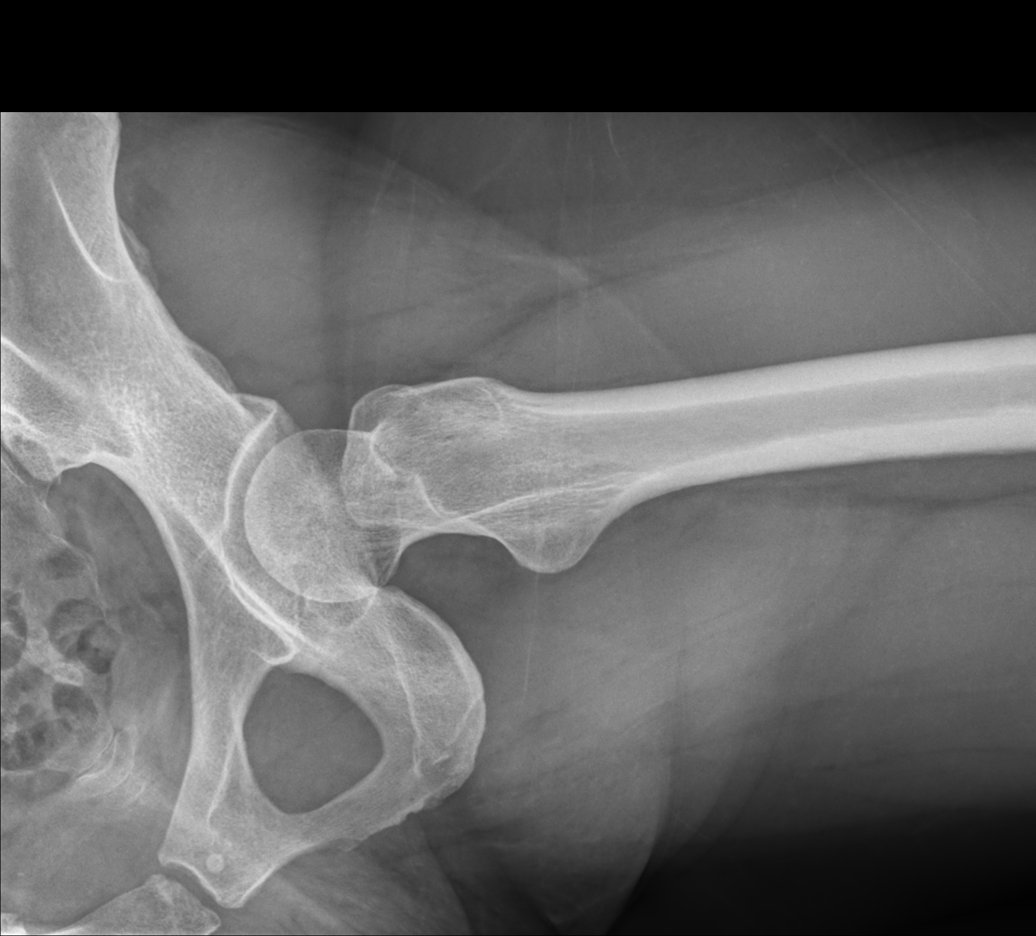

[hip joint (frog view) (2 of 2)]
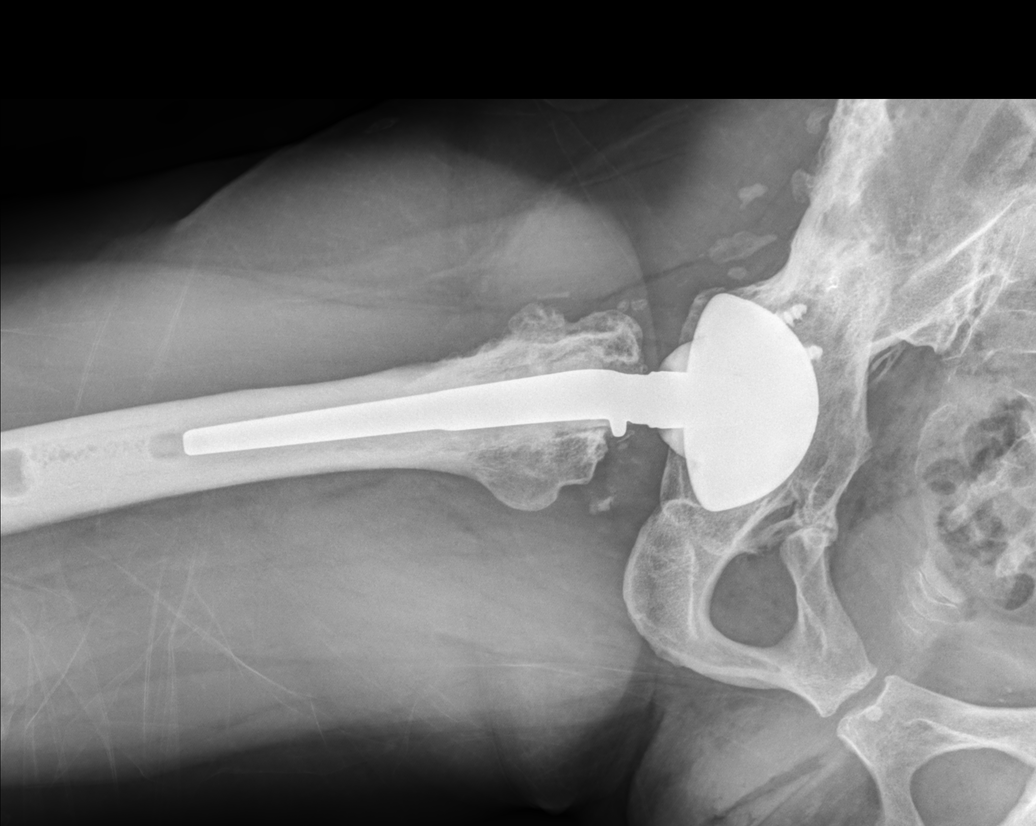

[5 of 5 positions shown; findings below may reference images not displayed]

FINDINGS: Frontal pelvis as well as frontal and lateral views of each hip
-total five views -obtained. There is a total hip prosthesis on the
right. Prosthesis appears well-seated, although there is
periprosthetic osteoporosis. There is evidence of old trauma
involving the right acetabulum with remodeling. There is a nonunited
chronic fracture of the lateral right superior pubic ramus with
remodeling. No acute fracture or dislocation evident. Left hip joint
appears normal. There is evidence of old trauma with remodeling
along the lateral right superior iliac crest. Scattered foci of
myositis ossificans are noted lateral to the right iliac crest.
IMPRESSION: Old trauma involving multiple sites in the right pelvis. Total hip
replacement on the right which appears well seated, although there
is periprosthetic osteoporosis. No acute fracture or dislocation.
Nonunited fracture of the lateral right superior pubic ramus,
chronic. Left hip joint appears unremarkable.

## 2017-07-18 NOTE — Progress Notes (Signed)
Wendy BlacksmithLori Davies is a 57 y.o. female is here for follow up.  History of Present Illness:   HPI: Followed by Dr. Dierdre ForthBeekman. Working on figuring out if she has psoriatic arthritis versus rheumatoid arthritis versus a mixed picture. 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 Still with pain and swelling of multiple joints. Took two week taper of prednisone last month and feels that it was very helpful. Stated that she could move her toes for the first time in forever. Wants to make sure that PCP and Rheumatology are on the same page.   Has been working with Dr. Berline Choughigby re: right shoulder. Much improved.  Has personal trainer now.     Health Maintenance Due  Topic Date Due  . Hepatitis C Screening  04/12/1960  . TETANUS/TDAP  12/07/1978   Depression screen PHQ 2/9 07/19/2017  Decreased Interest 0  Down, Depressed, Hopeless 0  PHQ - 2 Score 0   PMHx, SurgHx, SocialHx, FamHx, Medications, and Allergies were reviewed in the Visit Navigator and updated as appropriate.   Patient Active Problem List   Diagnosis Date Noted  . Rotator cuff impingement syndrome of right shoulder 09/26/2016  . Bilateral carpal tunnel syndrome 08/28/2016  . Osteoarthritis of spine with radiculopathy, cervical region 08/28/2016  . Chronic right shoulder pain 08/28/2016  . Psoriatic arthritis (HCC) 08/26/2016  . 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 Spasm of cervical paraspinous muscle 08/26/2016  . Joint pain in fingers of right hand 08/26/2016  . 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 History of right hip replacement 08/26/2016  . Joint pain in fingers of left hand 07/26/2016  . Back pain 07/26/2016  . Chronic pain of right hip 07/26/2016  . Gluten intolerance 07/26/2016   Social History   Tobacco Use  . Smoking status: Never Smoker  . Smokeless tobacco: Never Used  Substance Use Topics  . Alcohol use: Yes  . Drug use: No   Current Medications and Allergies:   Current Outpatient Medications:  .   Diclofenac Sodium (PENNSAID) 2 % SOLN, Place 1 application onto the skin 2 (two) times daily., Disp: 112 g, Rfl: 2 .  DIVIGEL 1 MG/GM GEL, , Disp: , Rfl: 0 .  montelukast (SINGULAIR) 10 MG tablet, Take 1 tablet (10 mg total) by mouth every evening., Disp: 90 tablet, Rfl: 2 .  nitroGLYCERIN (NITRODUR - DOSED IN MG/24 HR) 0.2 mg/hr patch, Place 1/4 to 1/2 of a patch over affected region. Remove and replace once daily.  Slightly alter skin placement daily, Disp: 30 patch, Rfl: 1 .  Secukinumab (COSENTYX 300 DOSE) 150 MG/ML SOSY, Inject 150 mg/mL into the skin. 300 mg once every 4 weeks, Disp: , Rfl:    Allergies  Allergen Reactions  . Hydromorphone   . Morphine And Related   . Pollen Extract   . Pork-Derived Products    Review of Systems   Pertinent items are noted in the HPI. Otherwise, ROS is negative.  Vitals:   Vitals:   07/19/17 0706  BP: 116/74  Pulse: 86  Temp: 98.3 F (36.8 C)  TempSrc: Oral  SpO2: 96%  Weight: 130 lb 9.6 oz (59.2 kg)  Height: 4\' 10"  (1.473 m)     Body mass index is 27.3 kg/m.  Physical Exam:   Physical Exam  Constitutional: She is oriented to person, place, and time. She appears well-developed and well-nourished. No distress.  HENT:  Head: Normocephalic and atraumatic.  Right Ear: External ear normal.  Left Ear:  External ear normal.  Nose: Nose normal.  Mouth/Throat: Oropharynx is clear and moist.  Eyes: Pupils are equal, round, and reactive to light. Conjunctivae and EOM are normal.  Neck: Normal range of motion. Neck supple. No thyromegaly present.  Cardiovascular: Normal rate, regular rhythm, normal heart sounds and intact distal pulses.  Pulmonary/Chest: Effort normal and breath sounds normal.  Abdominal: Soft. Bowel sounds are normal.  Musculoskeletal: Normal range of motion.  Lymphadenopathy:    She has no cervical adenopathy.  Neurological: She is alert and oriented to person, place, and time.  Skin: Skin is warm and dry. Capillary  refill takes less than 2 seconds.  Psychiatric: She has a normal mood and affect. Her behavior is normal.  Nursing note and vitals reviewed.   Assessment and Plan:   Exa was seen today for follow-up.  Diagnoses and all orders for this visit:  Need for diphtheria-tetanus-pertussis (Tdap) vaccine -     Tdap vaccine greater than or equal to 7yo IM  Need for shingles vaccine Comments: Patient wants to wait until another visit since she is getting Tdap today.  Screening for lipid disorders -     Lipid panel; Future  Psoriatic arthritis (HCC) Comments: Recent prednisone taper x 2 weeks. Felt great. "Able to move big toes for the first time in years." Orders: -     CBC with Differential/Platelet; Future -     Comprehensive metabolic panel; Future  Gluten intolerance -     CBC with Differential/Platelet; Future -     Comprehensive metabolic panel; Future  Swelling of joint of left hand Comments: Dr. Dierdre Forth. PIP all finger joints swollen.   Swelling of finger joint of right hand Comments: Dr. Dierdre Forth. PIP all finger joints swollen. Nodules on PIP of right index finger.    . Reviewed expectations re: course of current medical issues. . Discussed self-management of symptoms. . Outlined signs and symptoms indicating need for more acute intervention. . Patient verbalized understanding and all questions were answered. Marland Kitchen Health Maintenance issues including appropriate healthy diet, exercise, and smoking avoidance were discussed with patient. . See orders for this visit as documented in the electronic medical record. . Patient received an After Visit Summary.  Helane Rima, DO [facility] 07/19/2017  Future Appointments  Date Time Provider Department Center  07/25/2017  8:15 AM [facility]  11/07/2017  8:20 AM Andrena Mews, DO [facility]  01/20/2018  9:00 AM Helane Rima, DO [facility]

## 2017-07-19 ENCOUNTER — Ambulatory Visit: Payer: BLUE CROSS/BLUE SHIELD | Admitting: Family Medicine

## 2017-07-19 ENCOUNTER — Encounter: Payer: Self-pay | Admitting: Family Medicine

## 2017-07-19 VITALS — BP 116/74 | HR 86 | Temp 98.3°F | Ht <= 58 in | Wt 130.6 lb

## 2017-07-19 DIAGNOSIS — L4050 Arthropathic psoriasis, unspecified: Secondary | ICD-10-CM | POA: Diagnosis not present

## 2017-07-19 DIAGNOSIS — Z13220 Encounter for screening for lipoid disorders: Secondary | ICD-10-CM | POA: Diagnosis not present

## 2017-07-19 DIAGNOSIS — Z23 Encounter for immunization: Secondary | ICD-10-CM | POA: Diagnosis not present

## 2017-07-19 DIAGNOSIS — K9041 Non-celiac gluten sensitivity: Secondary | ICD-10-CM

## 2017-07-19 DIAGNOSIS — M25441 Effusion, right hand: Secondary | ICD-10-CM

## 2017-07-19 DIAGNOSIS — M25442 Effusion, left hand: Secondary | ICD-10-CM | POA: Diagnosis not present

## 2017-07-25 ENCOUNTER — Other Ambulatory Visit: Payer: BLUE CROSS/BLUE SHIELD

## 2017-07-27 ENCOUNTER — Encounter: Payer: Self-pay | Admitting: Sports Medicine

## 2017-07-27 NOTE — Procedures (Signed)
LIMITED MSK ULTRASOUND OF Right hand Images were obtained and interpreted by myself, Gaspar BiddingMichael Amamda Curbow, DO  Images have been saved and stored to PACS system. Images obtained on: GE S7 Ultrasound machine  FINDINGS:  Subcutaneous nodule coming directly off of the third common flexor tendon within the palmar aspect of the hand.  There is slight increased neovascularity and hypoechoic change within the nodular region. IMPRESSION:  1. Peritendinous nodule consistent with likely rheumatoid-like nodule.

## 2017-08-02 DIAGNOSIS — M2550 Pain in unspecified joint: Secondary | ICD-10-CM | POA: Diagnosis not present

## 2017-08-02 DIAGNOSIS — L4059 Other psoriatic arthropathy: Secondary | ICD-10-CM | POA: Diagnosis not present

## 2017-08-02 DIAGNOSIS — L409 Psoriasis, unspecified: Secondary | ICD-10-CM | POA: Diagnosis not present

## 2017-08-06 ENCOUNTER — Encounter: Payer: Self-pay | Admitting: Family Medicine

## 2017-09-23 DIAGNOSIS — Z01419 Encounter for gynecological examination (general) (routine) without abnormal findings: Secondary | ICD-10-CM | POA: Diagnosis not present

## 2017-09-23 DIAGNOSIS — Z6826 Body mass index (BMI) 26.0-26.9, adult: Secondary | ICD-10-CM | POA: Diagnosis not present

## 2017-10-14 DIAGNOSIS — H2513 Age-related nuclear cataract, bilateral: Secondary | ICD-10-CM | POA: Diagnosis not present

## 2017-10-14 DIAGNOSIS — H04123 Dry eye syndrome of bilateral lacrimal glands: Secondary | ICD-10-CM | POA: Diagnosis not present

## 2017-10-14 DIAGNOSIS — H25013 Cortical age-related cataract, bilateral: Secondary | ICD-10-CM | POA: Diagnosis not present

## 2017-10-17 DIAGNOSIS — M2550 Pain in unspecified joint: Secondary | ICD-10-CM | POA: Diagnosis not present

## 2017-10-17 DIAGNOSIS — L4059 Other psoriatic arthropathy: Secondary | ICD-10-CM | POA: Diagnosis not present

## 2017-10-17 DIAGNOSIS — L409 Psoriasis, unspecified: Secondary | ICD-10-CM | POA: Diagnosis not present

## 2017-10-17 LAB — CBC AND DIFFERENTIAL
HCT: 40 (ref 36–46)
HEMOGLOBIN: 13.2 (ref 12.0–16.0)
Neutrophils Absolute: 1
PLATELETS: 237 (ref 150–399)
WBC: 2.9

## 2017-10-17 LAB — BASIC METABOLIC PANEL
BUN: 15 (ref 4–21)
CREATININE: 0.9 (ref 0.5–1.1)
Glucose: 81
POTASSIUM: 4.5 (ref 3.4–5.3)
Sodium: 140 (ref 137–147)

## 2017-10-18 LAB — HEPATIC FUNCTION PANEL
ALK PHOS: 47 (ref 25–125)
ALT: 16 (ref 7–35)
AST: 22 (ref 13–35)
Bilirubin, Total: 0.3

## 2017-11-07 ENCOUNTER — Ambulatory Visit: Payer: Self-pay

## 2017-11-07 ENCOUNTER — Ambulatory Visit: Payer: BLUE CROSS/BLUE SHIELD | Admitting: Sports Medicine

## 2017-11-07 ENCOUNTER — Encounter: Payer: Self-pay | Admitting: Sports Medicine

## 2017-11-07 VITALS — BP 128/82 | HR 74 | Ht <= 58 in | Wt 130.0 lb

## 2017-11-07 DIAGNOSIS — R2231 Localized swelling, mass and lump, right upper limb: Secondary | ICD-10-CM

## 2017-11-07 DIAGNOSIS — M25442 Effusion, left hand: Secondary | ICD-10-CM

## 2017-11-07 DIAGNOSIS — L4050 Arthropathic psoriasis, unspecified: Secondary | ICD-10-CM | POA: Diagnosis not present

## 2017-11-07 DIAGNOSIS — M25511 Pain in right shoulder: Secondary | ICD-10-CM | POA: Diagnosis not present

## 2017-11-07 DIAGNOSIS — R229 Localized swelling, mass and lump, unspecified: Secondary | ICD-10-CM

## 2017-11-07 DIAGNOSIS — G5603 Carpal tunnel syndrome, bilateral upper limbs: Secondary | ICD-10-CM

## 2017-11-07 DIAGNOSIS — M4690 Unspecified inflammatory spondylopathy, site unspecified: Secondary | ICD-10-CM

## 2017-11-07 DIAGNOSIS — M180 Bilateral primary osteoarthritis of first carpometacarpal joints: Secondary | ICD-10-CM

## 2017-11-07 NOTE — Patient Instructions (Addendum)
Also check out [employer]" which is a program developed by Dr. Myles Lipps.   There are links to a couple of his YouTube Videos below and I would like to see performing one of his videos 5-6 days per week.    A good intro video is: "Independence from Pain 7-minute Video" - [email or web address]   Exercises that focus more on the neck are as below: Dr. Derrill Kay with [facility] teaching neck and shoulder details Part 1 - [email or web address] Part 2 Dr. Derrill Kay with [facility] quick routine to practice daily - https://[email or web address]  Do not try to attempt the entire video when first beginning.    Try breaking of each exercise that he goes into shorter segments.  Otherwise if they perform an exercise for 45 seconds, start with 15 seconds and rest and then resume when they begin the new activity.  If you work your way up to being able to do these videos without having to stop, I expect you will see significant improvements in your pain.  If you enjoy his videos and would like to find out more you can look on his website: [email or web address].  He has a workout streaming option as well as a DVD set available for purchase.  Amazon has the best price for his DVDs.     I recommend you obtained a compression sleeve to help with your joint problems. There are many options on the market however I recommend obtaining a wrist Body Helix compression sleeve.  You can find information (including how to appropriate measure yourself for sizing) can be found at [email or web address] [email or web address].ch.  Many of these products are health savings account (HSA) eligible.   You can use the compression sleeve at any time throughout the day but is most important to use while being active as well as for 2 hours post-activity.   It is appropriate to ice following activity with the compression sleeve in place.

## 2017-11-07 NOTE — Procedures (Signed)
LIMITED MSK ULTRASOUND OF Right hand Images were obtained and interpreted by myself, Gaspar Bidding, DO  Images have been saved and stored to PACS system. Images obtained on: GE S7 Ultrasound machine  FINDINGS:   There are 2 small subcutaneous nodules midportion of the hand directly over the third and fourth flexor tendons.  There is good tendon glide directly beneath these nodules  She has some small amount dactylitis.   IMPRESSION:  1. Small subcutaneous nodules secondary to either psoriatic changes or early Dupuytren's contracture.

## 2017-11-07 NOTE — Progress Notes (Signed)
Wendy Davies. Wendy Davies Sports Medicine [facility] [phone]  Wendy Davies - 57 y.o. female MRN [id number]  Date of birth: 04/22/1960  Visit Date: 11/07/2017  PCP: Helane Rima, DO   Referred by: Helane Rima, DO  Scribe for today's visit: Stevenson Clinch, CMA     SUBJECTIVE:  Wendy Davies is here for Follow-up (R shoulder, R hand) .   HPI: Info from last OV on 03/25/17: RT shoulder pain Compared to the last office visit, her previously described symptoms are improving, the shoulder feels stronger and ROM has improved.  Current symptoms are mild & are nonradiating She has been following Nitro Protocol and doing home exercises. She has also started working with a Systems analyst and she feels that this has been beneficial.    Bilateral wrist pain Compared to the last office visit, her previously described symptoms are improving, still has some numbness in fingers on RT hand. LT wrist isn't doing as good but she does feel like the strength is coming back. She feels a pulling sensation around the LT thumb. She continues to have bruising in the palm of the LT hand.  Current symptoms are moderate & are radiating to fingers She has been wearing wrist guards and icing the LT wrist.   05/09/17: R Shoulder Pain- Compared to the last office visit on 03/25/17, her previously described R shoulder pain symptoms are improving with noted 70% improvement Current symptoms are moderate & are nonradiating She has been using her nitroglycerin patches and feels they are helping.  She has also started working w/ a Systems analyst 2x/week.   B wrist pain -  Compared to the last office visit, her previously described B wrist pain symptoms are improving w/ noted 50% improvement.  Still has numbness on the tips of the R fingers 1-3.  Has gotten some increased muscle mass along the R thenar eminence. Current symptoms are moderate & are radiating to B fingers She has  been wearing her B wrist splints and icing prn. Rhematologist is having her do a burst/taper of prednisone.  11/07/2017: R shoulder pain Compared to the last office visit, her previously described symptoms are improving. She still has occasional pain on the anterior aspect of the shoulder. She reports occasional posterior pain with she "over does it".  Current symptoms are mild & are nonradiating She has been doing exercises at home and feels that this has been beneficial. She is no longer following Nitro Protocol.   B hand pain Compared to the last office visit, her previously described symptoms are improving. Flexibility and strength are a little better. The numbness has evaded a little bit, still has some numbness in the finger tips. She reports pulling sensation at the thumb. She reports painful nodules on the palm of the R hand - an additional knot has appeared on the R hand. She has tried Pennsaid for that with no relief. She still has trouble controlling. Her tennis racket.  Current symptoms are moderate & are nonradiating She has been wrapping her wrists while playing tennis. She is not using Pennsaid because it was ineffective.    ROS  Reports night time disturbances. Denies fevers, chills, or night sweats. Denies unexplained weight loss. Denies personal history of cancer. Denies changes in bowel or bladder habits. Denies recent unreported falls. Denies new or worsening dyspnea or wheezing. Denies headaches or dizziness.  Reports numbness, tingling or weakness  In the extremities - R fingers 1-3 Denies  dizziness or presyncopal episodes Denies lower extremity edema    HISTORY:  Prior history reviewed and updated per electronic medical record.  Social History   Occupational History  . Not on file  Tobacco Use  . Smoking status: Never Smoker  . Smokeless tobacco: Never Used  Substance and Sexual Activity  . Alcohol use: Yes  . Drug use: No  . Sexual activity: Yes    Social History   Social History Narrative  . Not on file     DATA OBTAINED & REVIEWED:  No results for input(s): HGBA1C, LABURIC, CREATINE in the last 8760 hours. .   OBJECTIVE:  VS:  HT:4\' 10"  (147.3 cm)   WT:130 lb (59 kg)  BMI:27.18    BP:128/82  HR:74bpm  TEMP: ( )  RESP:98 %   PHYSICAL EXAM: CONSTITUTIONAL: Well-developed, Well-nourished and In no acute distress PSYCHIATRIC: Alert & appropriately interactive. and Not depressed or anxious appearing. RESPIRATORY: No increased work of breathing and Trachea Midline EYES: Pupils are equal., EOM intact without nystagmus. and No scleral icterus.  VASCULAR EXAM: Warm and well perfused NEURO: unremarkable   MSK Exam: Right hand  Slight ulnar deviation of the second and third fingers, worsened from prior with generalized dactylitis that is mild of the second and third fingers bilaterally. Postsurgical incisions over the carpal tunnel well-healed.  She has a small puckering of the skin over the midportion of the hand. Mild tenderness over the midportion of the hand over the flexor tendons but this is minimal.  Slight nodularity.   RANGE OF MOTION & STRENGTH  Well-maintained flexion and extension of fingers and hands.   SPECIALITY TESTING:  No significant pain over the A1 pulleys.     ASSESSMENT   1. Right shoulder pain, unspecified chronicity   2. Nodule of finger of right hand   3. Bilateral carpal tunnel syndrome   4. Psoriatic arthritis (HCC)   5. Arthritis of carpometacarpal (CMC) joints of both thumbs   6. Swelling of joint of left hand   7. Subcutaneous nodules   8. 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  Procedures:  . None  Medications:  No orders of the defined types were placed in this  encounter.  Discussion/Instructions: No problem-specific Assessment & Plan notes found for this encounter.  . Long discussion today regarding options for her back, hip, neck pain and generalized wellness.  Her shoulder is significantly improved.  Discussed the underlying features of tight hip flexors leading to crouched, fetal like position that results in spinal column compression.  Including lumbar hyperflexion with hypermobility, thoracic flexion with restrictive rotation and cervical lordosis reversal  . Given the pain in the wrists and hands we will set her up with bilateral wrist Body Helix Compression Sleeve..  Also discussed cool water soaking. . Discussed red flag symptoms that warrant earlier emergent evaluation and patient voices understanding. . Activity modifications and the importance of avoiding exacerbating activities (limiting pain to no more than a 4 / 10 during or following activity) recommended and discussed.  Follow-up:  . Return if symptoms worsen or fail to improve.   . If any lack of improvement consider: further diagnostic evaluation with X-rays of the hands  . At follow up will plan to consider: Intradermal injections of corticosteroid if persistently symptomatic.     CMA/ATC served as Neurosurgeon during this visit. History,  Physical, and Plan performed by medical provider. Documentation and orders reviewed and attested to.      Andrena Mews, DO    [facility]

## 2017-11-21 NOTE — Progress Notes (Signed)
Wendy Davies is a 57 y.o. female is here for follow up.  History of Present Illness:   HPI:   1. Psoriatic arthritis (HCC). [facility] and felt  80% better but Rheumatology switched to Enbrel. Reaction. Patient wants to be off medications and trial Elimination Diet. She understands that she will likely be on a medication again as her condition is progressive, due to inflammation. She is hoping to go back on Taltz with prn prednisone if possible. Husband with UC, so she has seen Prednisone used long term. She understands the benefits and downside.    2. Psoriasis of scalp. She is interested in seeing Dermatology for this and for multiple itchy moles on her body.     Health Maintenance Due  Topic Date Due  . Hepatitis C Screening  10/10/1960  . MAMMOGRAM  09/01/2017   Depression screen PHQ 2/9 07/19/2017  Decreased Interest 0  Down, Depressed, Hopeless 0  PHQ - 2 Score 0   PMHx, SurgHx, SocialHx, FamHx, Medications, and Allergies were reviewed in the Visit Navigator and updated as appropriate.   Patient Active Problem List   Diagnosis Date Noted  . Rotator cuff impingement syndrome of right shoulder 09/26/2016  . Bilateral carpal tunnel syndrome 08/28/2016  . Osteoarthritis of spine with radiculopathy, cervical region 08/28/2016  . Chronic right shoulder pain 08/28/2016  . Psoriatic arthritis (HCC) 08/26/2016  . 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 Spasm of cervical paraspinous muscle 08/26/2016  . Joint pain in fingers of right hand 08/26/2016  . 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 History of right hip replacement 08/26/2016  . Joint pain in fingers of left hand 07/26/2016  . Back pain 07/26/2016  . Chronic pain of right hip 07/26/2016  . Gluten intolerance 07/26/2016   Social History   Tobacco Use  . Smoking status: Never Smoker  . Smokeless tobacco: Never Used  Substance Use Topics  . Alcohol use: Yes  . Drug use: No   Current Medications and  Allergies:   .  DIVIGEL 1 MG/GM GEL, , Disp: , Rfl: 0 .  montelukast (SINGULAIR) 10 MG tablet, Take 1 tablet (10 mg total) by mouth every evening., Disp: 90 tablet, Rfl: 2 .  nitroGLYCERIN (NITRODUR - DOSED IN MG/24 HR) 0.2 mg/hr patch, Place 1/4 to 1/2 of a patch over affected region. Remove and replace once daily.  Slightly alter skin placement daily, Disp: 30 patch, Rfl: 1   Allergies  Allergen Reactions  . Enbrel [Etanercept]     Drops WBC  . Hydromorphone   . Morphine And Related   . Pollen Extract   . Pork-Derived Products    Review of Systems   Pertinent items are noted in the HPI. Otherwise, ROS is negative.  Vitals:   Vitals:   11/22/17 1031  BP: 126/88  Pulse: 78  Temp: 97.8 F (36.6 C)  TempSrc: Oral  SpO2: 98%  Weight: 128 lb 12.8 oz (58.4 kg)  Height: 4\' 10"  (1.473 m)     Body mass index is 26.92 kg/m.  Physical Exam:   Physical Exam  Constitutional: She is oriented to person, place, and time. She appears well-developed and well-nourished. No distress.  HENT:  Head: Normocephalic and atraumatic.  Right Ear: External ear normal.  Left Ear: External ear normal.  Nose: Nose normal.  Mouth/Throat: Oropharynx is clear and moist.  Eyes: Pupils are equal, round, and reactive to light. Conjunctivae and EOM are normal.  Neck: Normal range  of motion. Neck supple. No thyromegaly present.  Cardiovascular: Normal rate, regular rhythm, normal heart sounds and intact distal pulses.  Pulmonary/Chest: Effort normal and breath sounds normal.  Abdominal: Soft. Bowel sounds are normal.  Musculoskeletal: Normal range of motion.  Lymphadenopathy:    She has no cervical adenopathy.  Neurological: She is alert and oriented to person, place, and time.  Skin: Skin is warm and dry. Capillary refill takes less than 2 seconds.  Psychiatric: She has a normal mood and affect. Her behavior is normal.  Nursing note and vitals reviewed.  Assessment and Plan:   Lasharn was seen  today for medication refill.  Diagnoses and all orders for this visit:  Psoriatic arthritis (HCC) -     predniSONE (DELTASONE) 10 MG tablet; Take 1 tablet (10 mg total) by mouth daily with breakfast.  Psoriasis of scalp -     Ambulatory referral to Dermatology  Handout for elimination diet provided to start reading. She wants to start in January after researching more. Interested in Dietician follow up. Going to [place] next month. I gave safety net for Prednisone with instructions, see AVS. She has appointment with Rheumatology next week and will also discuss with them.  . Reviewed expectations re: course of current medical issues. . Discussed self-management of symptoms. . Outlined signs and symptoms indicating need for more acute intervention. . Patient verbalized understanding and all questions were answered. Marland Kitchen Health Maintenance issues including appropriate healthy diet, exercise, and smoking avoidance were discussed with patient. . See orders for this visit as documented in the electronic medical record. . Patient received an After Visit Summary.  Helane Rima, DO [facility] 11/22/2017

## 2017-11-22 ENCOUNTER — Ambulatory Visit: Payer: BLUE CROSS/BLUE SHIELD | Admitting: Family Medicine

## 2017-11-22 ENCOUNTER — Encounter: Payer: Self-pay | Admitting: Family Medicine

## 2017-11-22 ENCOUNTER — Encounter: Payer: Self-pay | Admitting: Surgical

## 2017-11-22 VITALS — BP 126/88 | HR 78 | Temp 97.8°F | Ht <= 58 in | Wt 128.8 lb

## 2017-11-22 DIAGNOSIS — L409 Psoriasis, unspecified: Secondary | ICD-10-CM | POA: Diagnosis not present

## 2017-11-22 DIAGNOSIS — L4050 Arthropathic psoriasis, unspecified: Secondary | ICD-10-CM

## 2017-11-22 MED ORDER — PREDNISONE 10 MG PO TABS: 10.0000 mg | ORAL_TABLET | Freq: Every day | ORAL | 1 refills | Status: DC

## 2017-11-22 NOTE — Patient Instructions (Signed)
I sent Prednisone 10 mg daily to your pharmacy with one refill.  When you go on vacation, okay to take 1 daily each morning until 1-2 days after you get back.  If you have a flair: take 3 tabs daily x 2 days, then 2 tabs daily x 2 days, then 1 tab daily x 2 days.

## 2017-11-25 ENCOUNTER — Encounter: Payer: Self-pay | Admitting: Physical Therapy

## 2017-12-03 DIAGNOSIS — L4059 Other psoriatic arthropathy: Secondary | ICD-10-CM | POA: Diagnosis not present

## 2017-12-03 DIAGNOSIS — L409 Psoriasis, unspecified: Secondary | ICD-10-CM | POA: Diagnosis not present

## 2017-12-03 DIAGNOSIS — M2550 Pain in unspecified joint: Secondary | ICD-10-CM | POA: Diagnosis not present

## 2017-12-05 ENCOUNTER — Encounter: Payer: Self-pay | Admitting: Family Medicine

## 2017-12-06 NOTE — Telephone Encounter (Signed)
See note

## 2017-12-06 NOTE — Telephone Encounter (Signed)
Copied from CRM [id number]. Topic: Quick Communication - See Telephone Encounter >> Dec 06, 2017  3:53 PM Wendy Davies A wrote: CRM for notification. See Telephone encounter for: 12/06/17.  Pt called in about the mychart message.  She contacted the pharmacy about the steroid cream but they told her that they have not rec'd anything yet.  Please advise   Pharmacy - [facility]  Cb number - [phone]

## 2017-12-09 ENCOUNTER — Other Ambulatory Visit: Payer: Self-pay

## 2017-12-09 MED ORDER — TRIAMCINOLONE ACETONIDE 0.5 % EX OINT: 1.0000 "application " | TOPICAL_OINTMENT | Freq: Two times a day (BID) | CUTANEOUS | 0 refills | Status: DC

## 2017-12-11 ENCOUNTER — Encounter: Payer: Self-pay | Admitting: Family Medicine

## 2017-12-19 ENCOUNTER — Other Ambulatory Visit: Payer: Self-pay | Admitting: Family Medicine

## 2017-12-30 ENCOUNTER — Encounter: Payer: Self-pay | Admitting: Family Medicine

## 2017-12-30 MED ORDER — MONTELUKAST SODIUM 10 MG PO TABS: 10.0000 mg | ORAL_TABLET | Freq: Every evening | ORAL | 3 refills | Status: DC

## 2018-01-14 DIAGNOSIS — M2550 Pain in unspecified joint: Secondary | ICD-10-CM | POA: Diagnosis not present

## 2018-01-14 DIAGNOSIS — L4059 Other psoriatic arthropathy: Secondary | ICD-10-CM | POA: Diagnosis not present

## 2018-01-14 DIAGNOSIS — L409 Psoriasis, unspecified: Secondary | ICD-10-CM | POA: Diagnosis not present

## 2018-01-19 NOTE — Progress Notes (Signed)
Wendy Davies is a 57 y.o. female is here for follow up.  History of Present Illness:   HPI: Back from vacation. Took the Prednisone daily, 5 mg. Rheumatology agreed with plan and now they will start weaning. Considering trial of Stellara. Starting elimination diet.   Health Maintenance Due  Topic Date Due  . MAMMOGRAM  09/01/2017   Depression screen PHQ 2/9 07/19/2017  Decreased Interest 0  Down, Depressed, Hopeless 0  PHQ - 2 Score 0   PMHx, SurgHx, SocialHx, FamHx, Medications, and Allergies were reviewed in the Visit Navigator and updated as appropriate.   Patient Active Problem List   Diagnosis Date Noted  . Rotator cuff impingement syndrome of right shoulder 09/26/2016  . Bilateral carpal tunnel syndrome 08/28/2016  . Osteoarthritis of spine with radiculopathy, cervical region 08/28/2016  . Chronic right shoulder pain 08/28/2016  . Psoriatic arthritis (HCC) 08/26/2016  . 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 Spasm of cervical paraspinous muscle 08/26/2016  . Joint pain in fingers of right hand 08/26/2016  . 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 History of right hip replacement 08/26/2016  . Joint pain in fingers of left hand 07/26/2016  . Back pain 07/26/2016  . Chronic pain of right hip 07/26/2016  . Gluten intolerance 07/26/2016   Social History   Tobacco Use  . Smoking status: Never Smoker  . Smokeless tobacco: Never Used  Substance Use Topics  . Alcohol use: Yes  . Drug use: No   Current Medications and Allergies:   .  DIVIGEL 1 MG/GM GEL, , Disp: , Rfl: 0 .  Ixekizumab (TALTZ) 80 MG/ML SOSY, Inject into the skin. inj ever 2 weeks, Disp: , Rfl:  .  montelukast (SINGULAIR) 10 MG tablet, Take 1 tablet (10 mg total) by mouth every evening., Disp: 90 tablet, Rfl: 3 .  nitroGLYCERIN (NITRODUR - DOSED IN MG/24 HR) 0.2 mg/hr patch, Place 1/4 to 1/2 of a patch over affected region. Remove and replace once daily.  Slightly alter skin placement daily,  Disp: 30 patch, Rfl: 1 .  predniSONE (DELTASONE) 10 MG tablet, Take 1 tablet (10 mg total) by mouth daily with breakfast., Disp: 30 tablet, Rfl: 1 .  triamcinolone ointment (KENALOG) 0.5 %, Apply 1 application topically 2 (two) times daily., Disp: 30 g, Rfl: 0   Allergies  Allergen Reactions  . Enbrel [Etanercept]     Drops WBC  . Hydromorphone   . Morphine And Related   . Pollen Extract   . Pork-Derived Products    Review of Systems   Pertinent items are noted in the HPI. Otherwise, a complete ROS is negative.  Vitals:   Vitals:   01/20/18 0910 01/20/18 0914  BP: (!) 130/100 (!) 138/94  Pulse: 73   Temp: 98.3 F (36.8 C)   TempSrc: Oral   SpO2: 98%   Weight: 132 lb 9.6 oz (60.1 kg)   Height: 4\' 10"  (1.473 m)      Body mass index is 27.71 kg/m.  Physical Exam:   Physical Exam Vitals signs and nursing note reviewed.  HENT:     Head: Normocephalic and atraumatic.  Eyes:     Pupils: Pupils are equal, round, and reactive to light.  Neck:     Musculoskeletal: Normal range of motion and neck supple.  Cardiovascular:     Rate and Rhythm: Normal rate and regular rhythm.     Heart sounds: Normal heart sounds.  Pulmonary:     Effort:  Pulmonary effort is normal.  Abdominal:     Palpations: Abdomen is soft.  Skin:    General: Skin is warm.  Psychiatric:        Behavior: Behavior normal.    Results for orders placed or performed in visit on 11/25/17  CBC and differential  Result Value Ref Range   Hemoglobin 13.2 12.0 - 16.0   HCT 40 36 - 46   Neutrophils Absolute 1    Platelets 237 150 - 399   WBC 2.9   Basic metabolic panel  Result Value Ref Range   Glucose 81    BUN 15 4 - 21   Creatinine 0.9 0.5 - 1.1   Potassium 4.5 3.4 - 5.3   Sodium 140 137 - 147  Hepatic function panel  Result Value Ref Range   Alkaline Phosphatase 47 25 - 125   ALT 16 7 - 35   AST 22 13 - 35   Bilirubin, Total 0.3    Assessment and Plan:   Wendy Davies was seen today for 6 mo follow  up.  Diagnoses and all orders for this visit:  Psoriatic arthritis (HCC)  Gluten intolerance   . Orders and follow up as documented in [facility], reviewed diet, exercise and weight control, cardiovascular risk and specific lipid/LDL goals reviewed, reviewed medications and side effects in detail.  . Reviewed expectations re: course of current medical issues. . Outlined signs and symptoms indicating need for more acute intervention. . Patient verbalized understanding and all questions were answered. . Patient received an After Visit Summary.  Wendy RimaErica Deng Kemler, DO [place] 01/22/2018

## 2018-01-20 ENCOUNTER — Ambulatory Visit: Payer: BLUE CROSS/BLUE SHIELD | Admitting: Family Medicine

## 2018-01-20 ENCOUNTER — Encounter: Payer: Self-pay | Admitting: Family Medicine

## 2018-01-20 VITALS — BP 138/94 | HR 73 | Temp 98.3°F | Ht <= 58 in | Wt 132.6 lb

## 2018-01-20 DIAGNOSIS — K9041 Non-celiac gluten sensitivity: Secondary | ICD-10-CM

## 2018-01-20 DIAGNOSIS — L4050 Arthropathic psoriasis, unspecified: Secondary | ICD-10-CM

## 2018-01-22 ENCOUNTER — Encounter: Payer: Self-pay | Admitting: Family Medicine

## 2018-01-26 ENCOUNTER — Encounter: Payer: Self-pay | Admitting: Family Medicine

## 2018-01-27 ENCOUNTER — Other Ambulatory Visit: Payer: Self-pay

## 2018-01-27 MED ORDER — TRIAMCINOLONE ACETONIDE 0.5 % EX OINT: 1.0000 "application " | TOPICAL_OINTMENT | Freq: Two times a day (BID) | CUTANEOUS | 0 refills | Status: DC

## 2018-01-30 DIAGNOSIS — L4059 Other psoriatic arthropathy: Secondary | ICD-10-CM | POA: Diagnosis not present

## 2018-02-25 DIAGNOSIS — D1801 Hemangioma of skin and subcutaneous tissue: Secondary | ICD-10-CM | POA: Diagnosis not present

## 2018-02-25 DIAGNOSIS — D229 Melanocytic nevi, unspecified: Secondary | ICD-10-CM | POA: Diagnosis not present

## 2018-02-25 DIAGNOSIS — L814 Other melanin hyperpigmentation: Secondary | ICD-10-CM | POA: Diagnosis not present

## 2018-02-25 DIAGNOSIS — L821 Other seborrheic keratosis: Secondary | ICD-10-CM | POA: Diagnosis not present

## 2018-02-27 DIAGNOSIS — L4059 Other psoriatic arthropathy: Secondary | ICD-10-CM | POA: Diagnosis not present

## 2018-03-19 DIAGNOSIS — L409 Psoriasis, unspecified: Secondary | ICD-10-CM | POA: Diagnosis not present

## 2018-03-19 DIAGNOSIS — M2550 Pain in unspecified joint: Secondary | ICD-10-CM | POA: Diagnosis not present

## 2018-03-19 DIAGNOSIS — L4059 Other psoriatic arthropathy: Secondary | ICD-10-CM | POA: Diagnosis not present

## 2018-04-09 ENCOUNTER — Telehealth: Payer: Self-pay | Admitting: Family Medicine

## 2018-04-09 ENCOUNTER — Other Ambulatory Visit: Payer: Self-pay

## 2018-04-09 ENCOUNTER — Encounter: Payer: Self-pay | Admitting: Family Medicine

## 2018-04-09 ENCOUNTER — Ambulatory Visit (INDEPENDENT_AMBULATORY_CARE_PROVIDER_SITE_OTHER): Payer: BLUE CROSS/BLUE SHIELD | Admitting: Family Medicine

## 2018-04-09 VITALS — BP 132/88 | HR 87 | Temp 98.6°F | Ht <= 58 in | Wt 128.0 lb

## 2018-04-09 DIAGNOSIS — Z0000 Encounter for general adult medical examination without abnormal findings: Secondary | ICD-10-CM

## 2018-04-09 DIAGNOSIS — L4050 Arthropathic psoriasis, unspecified: Secondary | ICD-10-CM

## 2018-04-09 DIAGNOSIS — Z13220 Encounter for screening for lipoid disorders: Secondary | ICD-10-CM | POA: Diagnosis not present

## 2018-04-09 DIAGNOSIS — K9041 Non-celiac gluten sensitivity: Secondary | ICD-10-CM | POA: Diagnosis not present

## 2018-04-09 DIAGNOSIS — R21 Rash and other nonspecific skin eruption: Secondary | ICD-10-CM

## 2018-04-09 LAB — COMPREHENSIVE METABOLIC PANEL
ALT: 15 U/L (ref 0–35)
AST: 21 U/L (ref 0–37)
Albumin: 4.6 g/dL (ref 3.5–5.2)
Alkaline Phosphatase: 48 U/L (ref 39–117)
BUN: 16 mg/dL (ref 6–23)
CO2: 31 mEq/L (ref 19–32)
Calcium: 10 mg/dL (ref 8.4–10.5)
Chloride: 98 mEq/L (ref 96–112)
Creatinine, Ser: 1.02 mg/dL (ref 0.40–1.20)
GFR: 55.59 mL/min — ABNORMAL LOW (ref >60.00)
Glucose, Bld: 88 mg/dL (ref 70–99)
Potassium: 4.2 mEq/L (ref 3.5–5.1)
Sodium: 136 mEq/L (ref 135–145)
Total Bilirubin: 0.6 mg/dL (ref 0.2–1.2)
Total Protein: 6.7 g/dL (ref 6.0–8.3)

## 2018-04-09 LAB — CBC WITH DIFFERENTIAL/PLATELET
Basophils Absolute: 0 10*3/uL (ref 0.0–0.1)
Basophils Relative: 0.8 % (ref 0.0–3.0)
Eosinophils Absolute: 0.1 10*3/uL (ref 0.0–0.7)
Eosinophils Relative: 1.5 % (ref 0.0–5.0)
HCT: 40.8 % (ref 36.0–46.0)
Hemoglobin: 14.1 g/dL (ref 12.0–15.0)
Lymphocytes Relative: 26.4 % (ref 12.0–46.0)
Lymphs Abs: 1.1 10*3/uL (ref 0.7–4.0)
MCHC: 34.6 g/dL (ref 30.0–36.0)
MCV: 97.6 fl (ref 78.0–100.0)
Monocytes Absolute: 0.4 10*3/uL (ref 0.1–1.0)
Monocytes Relative: 10.5 % (ref 3.0–12.0)
Neutro Abs: 2.5 10*3/uL (ref 1.4–7.7)
Neutrophils Relative %: 60.8 % (ref 43.0–77.0)
Platelets: 191 10*3/uL (ref 150.0–400.0)
RBC: 4.18 Mil/uL (ref 3.87–5.11)
RDW: 12.3 % (ref 11.5–15.5)
WBC: 4.1 10*3/uL (ref 4.0–10.5)

## 2018-04-09 LAB — LIPID PANEL
Cholesterol: 248 mg/dL — ABNORMAL HIGH (ref 0–200)
HDL: 74.7 mg/dL (ref >39.00)
LDL Cholesterol: 153 mg/dL — ABNORMAL HIGH (ref 0–99)
NonHDL: 173.17
Total CHOL/HDL Ratio: 3
Triglycerides: 101 mg/dL (ref 0.0–149.0)
VLDL: 20.2 mg/dL (ref 0.0–40.0)

## 2018-04-09 MED ORDER — NYSTATIN 100000 UNIT/GM EX OINT: 1.0000 "application " | TOPICAL_OINTMENT | Freq: Two times a day (BID) | CUTANEOUS | 0 refills | Status: DC

## 2018-04-09 MED ORDER — TRIAMCINOLONE ACETONIDE 0.1 % EX CREA: 1.0000 "application " | TOPICAL_CREAM | Freq: Two times a day (BID) | CUTANEOUS | 0 refills | Status: DC

## 2018-04-09 MED ORDER — TRIAMCINOLONE ACETONIDE 0.5 % EX OINT: 1.0000 "application " | TOPICAL_OINTMENT | Freq: Two times a day (BID) | CUTANEOUS | 0 refills | Status: DC

## 2018-04-09 MED ORDER — PREDNISONE 10 MG PO TABS: 10.0000 mg | ORAL_TABLET | Freq: Every day | ORAL | 1 refills | Status: DC

## 2018-04-09 NOTE — Telephone Encounter (Signed)
Copied from CRM [id number]. Topic: Quick Communication - See Telephone Encounter >> Apr 09, 2018  9:45 AM Debroah Loop wrote: CRM for notification. See Telephone encounter for: 04/09/18. Please call [facility] to clarify dosage anfd quantity for triamcinolone ointment (KENALOG) 0.5 %.

## 2018-04-09 NOTE — Telephone Encounter (Signed)
Called gave change to 0.1% bid in jar

## 2018-04-09 NOTE — Telephone Encounter (Signed)
See note

## 2018-04-09 NOTE — Telephone Encounter (Signed)
Please advise 

## 2018-04-09 NOTE — Progress Notes (Signed)
Subjective:    Wendy Davies is a 58 y.o. female and is here for a comprehensive physical exam.  Patient is overall doing well.  She is still working on her elimination diet.  She has found that safe foods so far seem to be vegetables, chicken, nuts, beans, good red wine.  She is having more issues with meat and yeast, saying that it does increase inflammation and psoriatic outbreaks.  She continues to exercise.  Mammograms next week.  She has gone down about 5 pounds.  Still having red, itchy skin both axilla.  She uses triamcinolone sparingly.  No new exposures.  At 1 of our previous visits, I did give her prednisone 10 mg.  When she has a flare, she takes 5 mg at night and 5 mg in the morning and this seems to get rid of the flare quickly.  This is done sparingly.  Health Maintenance Due  Topic Date Due  . MAMMOGRAM  09/01/2017     Current Outpatient Medications:  .  DIVIGEL 1 MG/GM GEL, , Disp: , Rfl: 0 .  montelukast (SINGULAIR) 10 MG tablet, Take 1 tablet (10 mg total) by mouth every evening. (Patient not taking: Reported on 04/09/2018), Disp: 90 tablet, Rfl: 3 .  nystatin ointment (MYCOSTATIN), Apply 1 application topically 2 (two) times daily., Disp: 30 g, Rfl: 0 .  predniSONE (DELTASONE) 10 MG tablet, Take 1 tablet (10 mg total) by mouth daily with breakfast., Disp: 30 tablet, Rfl: 1 .  triamcinolone ointment (KENALOG) 0.5 %, Apply 1 application topically 2 (two) times daily., Disp: 464 g, Rfl: 0  PMHx, SurgHx, SocialHx, Medications, and Allergies were reviewed in the Visit Navigator and updated as appropriate.   Past Medical History:  Diagnosis Date  . Allergic rhinitis   . Cervical radiculopathy 03/27/2016  . Impingement syndrome of right shoulder   . Sialadenitis   . Sinusitis      Past Surgical History:  Procedure Laterality Date  . ABDOMINAL HYSTERECTOMY    . CESAREAN SECTION    . JOINT REPLACEMENT  1997   Rt hip:  [employer]     Family History  Problem  Relation Age of Onset  . Diabetes Mother   . Diabetes Brother   . Hypertension Brother     Social History   Tobacco Use  . Smoking status: Never Smoker  . Smokeless tobacco: Never Used  Substance Use Topics  . Alcohol use: Yes  . Drug use: No    Review of Systems:   Pertinent items are noted in the HPI. Otherwise, ROS is negative.  Objective:   BP 132/88   Pulse 87   Temp 98.6 F (37 C) (Oral)   Ht 4\' 10"  (1.473 m)   Wt 128 lb (58.1 kg)   LMP  (LMP Unknown)   SpO2 96%   BMI 26.75 kg/m   General appearance: alert, cooperative and appears stated age. Head: normocephalic, without obvious abnormality, atraumatic. Neck: no adenopathy, supple, symmetrical, trachea midline; thyroid not enlarged, symmetric, no tenderness/mass/nodules. Lungs: clear to auscultation bilaterally. Heart: regular rate and rhythm Abdomen: soft, non-tender; no masses,  no organomegaly. Extremities: extremities normal, atraumatic, no cyanosis or edema. Skin: red, itch skin bilateral axilla Lymph: cervical, supraclavicular, and axillary nodes normal; no abnormal inguinal nodes palpated. Neurologic: grossly normal.  Assessment/Plan:   [name] was seen today for annual exam.  Diagnoses and all orders for this visit:  Routine physical examination  Psoriatic arthritis (HCC) -  predniSONE (DELTASONE) 10 MG tablet; Take 1 tablet (10 mg total) by mouth daily with breakfast. -     Comprehensive metabolic panel -     CBC with Differential/Platelet  Screening for lipid disorders -     Lipid panel  Psoriatic arthritis (HCC) Comments: Recent prednisone taper x 2 weeks. Felt great. "Able to move big toes for the first time in years." Orders: -     predniSONE (DELTASONE) 10 MG tablet; Take 1 tablet (10 mg total) by mouth daily with breakfast. -     Comprehensive metabolic panel -     CBC with Differential/Platelet  Gluten intolerance -     Comprehensive metabolic panel -     CBC with  Differential/Platelet  Rash -     nystatin ointment (MYCOSTATIN); Apply 1 application topically 2 (two) times daily. -     triamcinolone ointment (KENALOG) 0.5 %; Apply 1 application topically 2 (two) times daily.    Patient Counseling: [x]    Nutrition: Stressed importance of moderation in sodium/caffeine intake, saturated fat and cholesterol, caloric balance, sufficient intake of fresh fruits, vegetables, fiber, calcium, iron, and 1 mg of folate supplement per day (for females capable of pregnancy).  [x]    Stressed the importance of regular exercise.   [x]    Substance Abuse: Discussed cessation/primary prevention of tobacco, alcohol, or other drug use; driving or other dangerous activities under the influence; availability of treatment for abuse.   [x]    Injury prevention: Discussed safety belts, safety helmets, smoke detector, smoking near bedding or upholstery.   [x]    Sexuality: Discussed sexually transmitted diseases, partner selection, use of condoms, avoidance of unintended pregnancy  and contraceptive alternatives.  [x]    Dental health: Discussed importance of regular tooth brushing, flossing, and dental visits.  [x]    Health maintenance and immunizations reviewed. Please refer to Health maintenance section.   Helane Rima, DO [facility]

## 2018-04-09 NOTE — Patient Instructions (Signed)
Preventive Care 40-64 Years, Female Preventive care refers to lifestyle choices and visits with your health care provider that can promote health and wellness. What does preventive care include?   A yearly physical exam. This is also called an annual well check.  Dental exams once or twice a year.  Routine eye exams. Ask your health care provider how often you should have your eyes checked.  Personal lifestyle choices, including: ? Daily care of your teeth and gums. ? Regular physical activity. ? Eating a healthy diet. ? Avoiding tobacco and drug use. ? Limiting alcohol use. ? Practicing safe sex. ? Taking low-dose aspirin daily starting at age 50. ? Taking vitamin and mineral supplements as recommended by your health care provider. What happens during an annual well check? The services and screenings done by your health care provider during your annual well check will depend on your age, overall health, lifestyle risk factors, and family history of disease. Counseling Your health care provider may ask you questions about your:  Alcohol use.  Tobacco use.  Drug use.  Emotional well-being.  Home and relationship well-being.  Sexual activity.  Eating habits.  Work and work environment.  Method of birth control.  Menstrual cycle.  Pregnancy history. Screening You may have the following tests or measurements:  Height, weight, and BMI.  Blood pressure.  Lipid and cholesterol levels. These may be checked every 5 years, or more frequently if you are over 50 years old.  Skin check.  Lung cancer screening. You may have this screening every year starting at age 55 if you have a 30-pack-year history of smoking and currently smoke or have quit within the past 15 years.  Colorectal cancer screening. All adults should have this screening starting at age 50 and continuing until age 75. Your health care provider may recommend screening at age 45. You will have tests every  1-10 years, depending on your results and the type of screening test. People at increased risk should start screening at an earlier age. Screening tests may include: ? Guaiac-based fecal occult blood testing. ? Fecal immunochemical test (FIT). ? Stool DNA test. ? Virtual colonoscopy. ? Sigmoidoscopy. During this test, a flexible tube with a tiny camera (sigmoidoscope) is used to examine your rectum and lower colon. The sigmoidoscope is inserted through your anus into your rectum and lower colon. ? Colonoscopy. During this test, a long, thin, flexible tube with a tiny camera (colonoscope) is used to examine your entire colon and rectum.  Hepatitis C blood test.  Hepatitis B blood test.  Sexually transmitted disease (STD) testing.  Diabetes screening. This is done by checking your blood sugar (glucose) after you have not eaten for a while (fasting). You may have this done every 1-3 years.  Mammogram. This may be done every 1-2 years. Talk to your health care provider about when you should start having regular mammograms. This may depend on whether you have a family history of breast cancer.  BRCA-related cancer screening. This may be done if you have a family history of breast, ovarian, tubal, or peritoneal cancers.  Pelvic exam and Pap test. This may be done every 3 years starting at age 21. Starting at age 30, this may be done every 5 years if you have a Pap test in combination with an HPV test.  Bone density scan. This is done to screen for osteoporosis. You may have this scan if you are at high risk for osteoporosis. Discuss your test results, treatment options,   and if necessary, the need for more tests with your health care provider. Vaccines Your health care provider may recommend certain vaccines, such as:  Influenza vaccine. This is recommended every year.  Tetanus, diphtheria, and acellular pertussis (Tdap, Td) vaccine. You may need a Td booster every 10 years.  Varicella  vaccine. You may need this if you have not been vaccinated.  Zoster vaccine. You may need this after age 38.  Measles, mumps, and rubella (MMR) vaccine. You may need at least one dose of MMR if you were born in 1957 or later. You may also need a second dose.  Pneumococcal 13-valent conjugate (PCV13) vaccine. You may need this if you have certain conditions and were not previously vaccinated.  Pneumococcal polysaccharide (PPSV23) vaccine. You may need one or two doses if you smoke cigarettes or if you have certain conditions.  Meningococcal vaccine. You may need this if you have certain conditions.  Hepatitis A vaccine. You may need this if you have certain conditions or if you travel or work in places where you may be exposed to hepatitis A.  Hepatitis B vaccine. You may need this if you have certain conditions or if you travel or work in places where you may be exposed to hepatitis B.  Haemophilus influenzae type b (Hib) vaccine. You may need this if you have certain conditions. Talk to your health care provider about which screenings and vaccines you need and how often you need them. This information is not intended to replace advice given to you by your health care provider. Make sure you discuss any questions you have with your health care provider. Document Released: 02/18/2015 Document Revised: 03/14/2017 Document Reviewed: 11/23/2014 Elsevier Interactive Patient Education  2019 [employer].

## 2018-04-09 NOTE — Telephone Encounter (Signed)
BID dose, jar.

## 2018-04-10 DIAGNOSIS — Z1231 Encounter for screening mammogram for malignant neoplasm of breast: Secondary | ICD-10-CM | POA: Diagnosis not present

## 2018-10-01 DIAGNOSIS — Z01419 Encounter for gynecological examination (general) (routine) without abnormal findings: Secondary | ICD-10-CM | POA: Diagnosis not present

## 2018-10-01 DIAGNOSIS — Z6827 Body mass index (BMI) 27.0-27.9, adult: Secondary | ICD-10-CM | POA: Diagnosis not present

## 2018-10-01 LAB — CBC AND DIFFERENTIAL: Hemoglobin: 13.7 (ref 12.0–16.0)

## 2018-10-14 NOTE — Progress Notes (Signed)
Wendy Davies is a 58 y.o. female is here for follow up.  History of Present Illness:   Wendy Davies, Wendy Davies acting as scribe for Wendy Davies.   HPI:   Psoriatic arthritis (HCC) At last visit was started on Prednisone 10mg  daily. She does not take daily. Only when she has flair up she will take 2 in the am one in the afternoon and one at night and it is normally resolved the next day. This seems to help a lot. She is averaging 1-2 flair ups a month and normally with excessive exercise (playing tennis 2 days in a row, then golf after).  Allergies: She has had increased issues with allergies. She has had nose sprays in the past but make her unable to sleep.    Health Maintenance Due  Topic Date Due  . MAMMOGRAM  09/01/2017   [facility] 10/15/2018 04/09/2018 07/19/2017  Decreased Interest 0 0 0  Down, Depressed, Hopeless 0 0 0  PHQ - 2 Score 0 0 0  Altered sleeping 0 0 -  Tired, decreased energy 0 1 -  Change in appetite 0 0 -  Feeling bad or failure about yourself  0 0 -  Trouble concentrating 0 0 -  Moving slowly or fidgety/restless 0 0 -  Suicidal thoughts 0 0 -  PHQ-9 Score 0 1 -  Difficult doing work/chores Not difficult at all Not difficult at all -   PMHx, SurgHx, SocialHx, FamHx, Medications, and Allergies were reviewed in the Visit Navigator and updated as appropriate.   Patient Active Problem List   Diagnosis Date Noted  . Allergic rhinitis 10/18/2018  . HLD (hyperlipidemia) 10/15/2018  . History of hysterectomy 10/15/2018  . Rotator cuff impingement syndrome of right shoulder 09/26/2016  . Bilateral carpal tunnel syndrome 08/28/2016  . Osteoarthritis of spine with radiculopathy, cervical region 08/28/2016  . Psoriatic arthritis (HCC) 08/26/2016  . 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 History of right hip replacement 08/26/2016  . Gluten intolerance 07/26/2016   Social History   Tobacco Use  . Smoking status: Never  Smoker  . Smokeless tobacco: Never Used  Substance Use Topics  . Alcohol use: Yes  . Drug use: No   Current Medications and Allergies   Current Outpatient Medications:  .  DIVIGEL 1 MG/GM GEL, , Disp: , Rfl: 0 .  montelukast (SINGULAIR) 10 MG tablet, Take 1 tablet (10 mg total) by mouth every evening., Disp: 90 tablet, Rfl: 3 .  nystatin ointment (MYCOSTATIN), Apply 1 application topically 2 (two) times daily., Disp: 30 g, Rfl: 1 .  predniSONE (DELTASONE) 10 MG tablet, Take 1 tablet (10 mg total) by mouth daily with breakfast., Disp: 30 tablet, Rfl: 1 .  triamcinolone cream (KENALOG) 0.1 %, Apply 1 application topically 2 (two) times daily., Disp: 453.6 g, Rfl: 0   Allergies  Allergen Reactions  . Enbrel [Etanercept]     Drops WBC  . Morphine And Related   . Pollen Extract   . Pork-Derived Products    Review of Systems   Pertinent items are noted in the HPI. Otherwise, a complete ROS is negative.  Vitals   Vitals:   10/15/18 0742  BP: 136/72  Pulse: 72  Temp: 98.7 F (37.1 C)  TempSrc: Oral  SpO2: 97%  Weight: 135 lb (61.2 kg)  Height: 4\' 10"  (1.473 m)     Body mass index is 28.22 kg/m.  Physical Exam   Physical Exam  Vitals signs and nursing note reviewed.  HENT:     Head: Normocephalic and atraumatic.  Eyes:     Pupils: Pupils are equal, round, and reactive to light.  Neck:     Musculoskeletal: Normal range of motion and neck supple.  Cardiovascular:     Rate and Rhythm: Normal rate and regular rhythm.     Heart sounds: Normal heart sounds.  Pulmonary:     Effort: Pulmonary effort is normal.  Abdominal:     Palpations: Abdomen is soft.  Skin:    General: Skin is warm.  Psychiatric:        Behavior: Behavior normal.    Assessment and Plan   Wendy Davies was seen today for follow-up.  Diagnoses and all orders for this visit:  Allergic rhinitis, food and environmental triggers -     montelukast (SINGULAIR) 10 MG tablet; Take 1 tablet (10 mg total) by  mouth every evening.  Screening mammogram, encounter for  Psoriatic arthritis ([place]) -     predniSONE (DELTASONE) 10 MG tablet; Take 1 tablet (10 mg total) by mouth daily with breakfast.  Chronic pain of right hip  Pure hypercholesterolemia  Rash -     nystatin ointment (MYCOSTATIN); Apply 1 application topically 2 (two) times daily. -     triamcinolone cream (KENALOG) 0.1 %; Apply 1 application topically 2 (two) times daily.    . Orders and follow up as documented in [place], reviewed diet, exercise and weight control, cardiovascular risk and specific lipid/LDL goals reviewed, reviewed medications and side effects in detail.  . Reviewed expectations re: course of current medical issues. . Outlined signs and symptoms indicating need for more acute intervention. . Patient verbalized understanding and all questions were answered. . Patient received an After Visit Summary.  Wendy Davies served as Education administrator during this visit. History, Physical, and Plan performed by medical provider. The above documentation has been reviewed and is accurate and complete. Wendy Davies, D.O.  Wendy Deutscher, DO [place], [facility] 10/18/2018

## 2018-10-15 ENCOUNTER — Encounter: Payer: Self-pay | Admitting: Family Medicine

## 2018-10-15 ENCOUNTER — Ambulatory Visit: Payer: BLUE CROSS/BLUE SHIELD | Admitting: Family Medicine

## 2018-10-15 ENCOUNTER — Other Ambulatory Visit: Payer: Self-pay

## 2018-10-15 VITALS — BP 136/72 | HR 72 | Temp 98.7°F | Ht <= 58 in | Wt 135.0 lb

## 2018-10-15 DIAGNOSIS — L4050 Arthropathic psoriasis, unspecified: Secondary | ICD-10-CM

## 2018-10-15 DIAGNOSIS — Z1231 Encounter for screening mammogram for malignant neoplasm of breast: Secondary | ICD-10-CM

## 2018-10-15 DIAGNOSIS — G8929 Other chronic pain: Secondary | ICD-10-CM

## 2018-10-15 DIAGNOSIS — R21 Rash and other nonspecific skin eruption: Secondary | ICD-10-CM

## 2018-10-15 DIAGNOSIS — M25551 Pain in right hip: Secondary | ICD-10-CM | POA: Diagnosis not present

## 2018-10-15 DIAGNOSIS — J309 Allergic rhinitis, unspecified: Secondary | ICD-10-CM | POA: Diagnosis not present

## 2018-10-15 DIAGNOSIS — Z90710 Acquired absence of both cervix and uterus: Secondary | ICD-10-CM | POA: Insufficient documentation

## 2018-10-15 DIAGNOSIS — E7800 Pure hypercholesterolemia, unspecified: Secondary | ICD-10-CM

## 2018-10-15 DIAGNOSIS — E785 Hyperlipidemia, unspecified: Secondary | ICD-10-CM

## 2018-10-15 HISTORY — DX: Hyperlipidemia, unspecified: E78.5

## 2018-10-15 MED ORDER — TRIAMCINOLONE ACETONIDE 0.1 % EX CREA: 1.0000 "application " | TOPICAL_CREAM | Freq: Two times a day (BID) | CUTANEOUS | 0 refills | Status: DC

## 2018-10-15 MED ORDER — NYSTATIN 100000 UNIT/GM EX OINT: 1.0000 "application " | TOPICAL_OINTMENT | Freq: Two times a day (BID) | CUTANEOUS | 1 refills | Status: DC

## 2018-10-15 MED ORDER — PREDNISONE 10 MG PO TABS: 10.0000 mg | ORAL_TABLET | Freq: Every day | ORAL | 1 refills | Status: DC

## 2018-10-15 MED ORDER — MONTELUKAST SODIUM 10 MG PO TABS: 10.0000 mg | ORAL_TABLET | Freq: Every evening | ORAL | 3 refills | Status: DC

## 2018-10-18 DIAGNOSIS — J309 Allergic rhinitis, unspecified: Secondary | ICD-10-CM | POA: Insufficient documentation

## 2018-10-19 ENCOUNTER — Encounter: Payer: Self-pay | Admitting: Family Medicine

## 2018-10-20 ENCOUNTER — Encounter: Payer: Self-pay | Admitting: Family Medicine

## 2018-10-20 DIAGNOSIS — H04123 Dry eye syndrome of bilateral lacrimal glands: Secondary | ICD-10-CM | POA: Diagnosis not present

## 2018-10-20 DIAGNOSIS — H25013 Cortical age-related cataract, bilateral: Secondary | ICD-10-CM | POA: Diagnosis not present

## 2018-10-20 DIAGNOSIS — H2513 Age-related nuclear cataract, bilateral: Secondary | ICD-10-CM | POA: Diagnosis not present

## 2018-10-20 DIAGNOSIS — H524 Presbyopia: Secondary | ICD-10-CM | POA: Diagnosis not present

## 2018-10-31 DIAGNOSIS — L4059 Other psoriatic arthropathy: Secondary | ICD-10-CM | POA: Diagnosis not present

## 2018-10-31 DIAGNOSIS — M2550 Pain in unspecified joint: Secondary | ICD-10-CM | POA: Diagnosis not present

## 2018-10-31 DIAGNOSIS — Z1329 Encounter for screening for other suspected endocrine disorder: Secondary | ICD-10-CM | POA: Diagnosis not present

## 2018-10-31 DIAGNOSIS — E559 Vitamin D deficiency, unspecified: Secondary | ICD-10-CM | POA: Diagnosis not present

## 2018-10-31 DIAGNOSIS — L409 Psoriasis, unspecified: Secondary | ICD-10-CM | POA: Diagnosis not present

## 2018-10-31 DIAGNOSIS — R5383 Other fatigue: Secondary | ICD-10-CM | POA: Diagnosis not present

## 2019-02-04 DIAGNOSIS — R5383 Other fatigue: Secondary | ICD-10-CM | POA: Diagnosis not present

## 2019-02-04 DIAGNOSIS — M2550 Pain in unspecified joint: Secondary | ICD-10-CM | POA: Diagnosis not present

## 2019-02-04 DIAGNOSIS — L4059 Other psoriatic arthropathy: Secondary | ICD-10-CM | POA: Diagnosis not present

## 2019-02-04 DIAGNOSIS — Z111 Encounter for screening for respiratory tuberculosis: Secondary | ICD-10-CM | POA: Diagnosis not present

## 2019-02-04 DIAGNOSIS — L409 Psoriasis, unspecified: Secondary | ICD-10-CM | POA: Diagnosis not present

## 2019-04-10 DIAGNOSIS — L409 Psoriasis, unspecified: Secondary | ICD-10-CM | POA: Diagnosis not present

## 2019-04-10 DIAGNOSIS — R5383 Other fatigue: Secondary | ICD-10-CM | POA: Diagnosis not present

## 2019-04-10 DIAGNOSIS — M2550 Pain in unspecified joint: Secondary | ICD-10-CM | POA: Diagnosis not present

## 2019-04-10 DIAGNOSIS — L4059 Other psoriatic arthropathy: Secondary | ICD-10-CM | POA: Diagnosis not present

## 2019-04-30 ENCOUNTER — Other Ambulatory Visit: Payer: Self-pay

## 2019-04-30 ENCOUNTER — Encounter: Payer: Self-pay | Admitting: Family Medicine

## 2019-04-30 ENCOUNTER — Ambulatory Visit (INDEPENDENT_AMBULATORY_CARE_PROVIDER_SITE_OTHER): Payer: BC Managed Care – PPO | Admitting: Family Medicine

## 2019-04-30 VITALS — BP 150/90 | HR 72 | Temp 97.7°F | Ht <= 58 in | Wt 139.8 lb

## 2019-04-30 DIAGNOSIS — R030 Elevated blood-pressure reading, without diagnosis of hypertension: Secondary | ICD-10-CM | POA: Diagnosis not present

## 2019-04-30 DIAGNOSIS — F320 Major depressive disorder, single episode, mild: Secondary | ICD-10-CM

## 2019-04-30 MED ORDER — FLUOXETINE HCL 20 MG PO CAPS: 20.0000 mg | ORAL_CAPSULE | Freq: Every day | ORAL | 1 refills | Status: DC

## 2019-04-30 NOTE — Patient Instructions (Addendum)
Blood pre ssure: it is elevated. Let's give you 3 months to work on diet/exercise. Bring you back and if still elevated will start medication. I also want you to get a cuff and start a log for me. Take it about 3-4x/week. If consistently over 140/90 call me and I may bring you in sooner.    Depression: we are starting medication. It is prozac take this daily in the AM. Will see you back in one month for follow up.   Let me know if any issues.   Fast when you come back!  We will do labs at that time.   Dr. Artis Flock

## 2019-04-30 NOTE — Progress Notes (Signed)
Patient: Wendy Davies MRN: [id number] DOB: 1960/11/28 PCP: Orma Flaming, MD     Subjective:  Chief Complaint  Patient presents with  . Transitions Of Care  . Depression    HPI: The patient is a 59 y.o. female who presents today for transition of care. She has a past medical history of OA, psoriatic arthritis (followed by Dr. Amil Amen), HLD. She is on medication simponi for her PA, but does not work well. Her last set of labs was over a year ago.   She has concerns for elevated blood pressure. She has had multiple readings in the 081K systolic at her rheumatologist and also today. She has gained 20 pounds and has had a lot of stress in her life. No hx of HTN and no family hx of HTN. She is asymptomatic.   Concerns for depression: she has lot going on and it's affecting her daily life. She is crying more often and is foggy. The stress and realizing she may never have grandkids has been weighing on her. She is not interested in counseling and is not exercising. No si/hi. Never been on medication in the past. No family history of depression or anxiety. She is also not sleeping.   Hyperlipidemia: on no medication. ascvd risk is 4%. Does not smoke.  The 10-year ASCVD risk score Mikey Bussing DC Jr., et al., 2013) is: 3.9%  Review of Systems  Constitutional: Negative for appetite change, chills, fatigue and fever.  HENT: Negative for dental problem, ear pain, hearing loss, sinus pressure, sinus pain and trouble swallowing.   Eyes: Negative for visual disturbance.  Respiratory: Negative for cough, chest tightness and shortness of breath.   Cardiovascular: Negative for chest pain, palpitations and leg swelling.  Gastrointestinal: Negative for abdominal pain, blood in stool, diarrhea and nausea.  Endocrine: Negative for cold intolerance, polydipsia, polyphagia and polyuria.  Genitourinary: Negative for dysuria and hematuria.  Musculoskeletal: Negative for arthralgias.  Skin: Negative for rash.   Neurological: Negative for dizziness, light-headedness and headaches.  Psychiatric/Behavioral: Positive for dysphoric mood and sleep disturbance. Negative for suicidal ideas. The patient is not nervous/anxious.     Allergies Patient is allergic to enbrel [etanercept]; morphine and related; pollen extract; and pork-derived products.  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  Surgical History Patient  has a past surgical history that includes Abdominal hysterectomy; Cesarean section; and Total hip arthroplasty (Right, 1997).  Family History Pateint's family history includes Arthritis/Rheumatoid in her mother; Diabetes in her brother and mother; Hypertension in her brother.  Social History Patient  reports that she has never smoked. She has never used smokeless tobacco. She reports current alcohol use. She reports that she does not use drugs.    Objective: Vitals:   04/30/19 0820 04/30/19 0851  BP: (!) 152/100 (!) 150/90  Pulse: 72   Temp: 97.7 F (36.5 C)   TempSrc: Temporal   SpO2: 99%   Weight: 139 lb 12.8 oz (63.4 kg)   Height: 4\' 10"  (1.473 m)     Body mass index is 29.22 kg/m.  Physical Exam Vitals reviewed.  Constitutional:      Appearance: Normal appearance.  HENT:     Head: Normocephalic and atraumatic.     Right  Ear: Tympanic membrane, ear canal and external ear normal.     Left Ear: Tympanic membrane, ear canal and external ear normal.     Mouth/Throat:     Mouth: Mucous membranes are moist.  Eyes:     Extraocular Movements: Extraocular movements  intact.     Conjunctiva/sclera: Conjunctivae normal.     Pupils: Pupils are equal, round, and reactive to light.  Neck:     Vascular: No carotid bruit.  Cardiovascular:     Rate and Rhythm: Normal rate and regular rhythm.     Heart sounds: Normal heart sounds.  Pulmonary:     Effort: Pulmonary effort is normal.     Breath sounds: Normal breath sounds.  Abdominal:     General: Abdomen is flat. Bowel sounds are normal.     Palpations: Abdomen is soft.  Musculoskeletal:     Cervical back: Normal range of motion and neck supple.  Lymphadenopathy:     Cervical: No cervical adenopathy.  Neurological:     General: No focal deficit present.     Mental Status: She is alert and oriented to person, place, and time.  Psychiatric:        Mood and Affect: Mood normal.        Behavior: Behavior normal.     Comments: No si/hi         Office Visit from 04/30/2019 in [facility]  PHQ-9 Total Score  8         Assessment/plan: 1. Depression, major, single episode, mild (HCC) phq9 score is mild, but it's affecting her daily life and she feels like she really needs something to help her. She doesn't want to do counseling. She knows she needs to start exercising. We are going to start low dose SSRI of prozac. I've explained to her that drugs of the SSRI class can have side effects such as weight gain, sexual dysfunction, insomnia, headache, nausea. These medications are generally effective at alleviating symptoms of anxiety and/or depression. Let me know if significant side effects do occur. Any si/hi she is to call 911 or go to ER. Close f/u in one month.    2. Elevated blood pressure reading Elevated today. Want her to keep a log over the next month and start to exercise again. Already following a low salt diet. If elevated on next visit will start medication. Stress and her lack of sleep may also be contributing.   -fasting labs at follow up. Chart reviewed including  medial problems, specialists, medication, allergies and HM.   This visit occurred during the SARS-CoV-2 public health emergency.  Safety protocols were in place, including screening questions prior to the visit, additional usage of staff PPE, and extensive cleaning of exam room while observing appropriate contact time as indicated for disinfecting solutions.     Return in about 1 month (around 05/31/2019) for depression and blood pressure. FAST! and bring log. Orland Mustard, MD [place] [facility]   04/30/2019

## 2019-06-04 ENCOUNTER — Ambulatory Visit: Payer: BC Managed Care – PPO | Admitting: Family Medicine

## 2019-06-29 ENCOUNTER — Encounter: Payer: Self-pay | Admitting: Family Medicine

## 2019-06-29 ENCOUNTER — Other Ambulatory Visit: Payer: Self-pay

## 2019-06-29 ENCOUNTER — Ambulatory Visit (INDEPENDENT_AMBULATORY_CARE_PROVIDER_SITE_OTHER): Payer: BC Managed Care – PPO | Admitting: Family Medicine

## 2019-06-29 VITALS — BP 180/100 | HR 81 | Temp 97.7°F | Ht <= 58 in | Wt 136.8 lb

## 2019-06-29 DIAGNOSIS — N760 Acute vaginitis: Secondary | ICD-10-CM

## 2019-06-29 DIAGNOSIS — I10 Essential (primary) hypertension: Secondary | ICD-10-CM

## 2019-06-29 DIAGNOSIS — Z0000 Encounter for general adult medical examination without abnormal findings: Secondary | ICD-10-CM

## 2019-06-29 DIAGNOSIS — B9689 Other specified bacterial agents as the cause of diseases classified elsewhere: Secondary | ICD-10-CM

## 2019-06-29 DIAGNOSIS — M720 Palmar fascial fibromatosis [Dupuytren]: Secondary | ICD-10-CM | POA: Diagnosis not present

## 2019-06-29 LAB — COMPREHENSIVE METABOLIC PANEL
ALT: 18 U/L (ref 0–35)
AST: 21 U/L (ref 0–37)
Albumin: 4.6 g/dL (ref 3.5–5.2)
Alkaline Phosphatase: 45 U/L (ref 39–117)
BUN: 14 mg/dL (ref 6–23)
CO2: 30 mEq/L (ref 19–32)
Calcium: 9.7 mg/dL (ref 8.4–10.5)
Chloride: 100 mEq/L (ref 96–112)
Creatinine, Ser: 0.94 mg/dL (ref 0.40–1.20)
GFR: 60.83 mL/min (ref >60.00)
Glucose, Bld: 90 mg/dL (ref 70–99)
Potassium: 4.6 mEq/L (ref 3.5–5.1)
Sodium: 138 mEq/L (ref 135–145)
Total Bilirubin: 0.6 mg/dL (ref 0.2–1.2)
Total Protein: 6.6 g/dL (ref 6.0–8.3)

## 2019-06-29 LAB — LIPID PANEL
Cholesterol: 269 mg/dL — ABNORMAL HIGH (ref 0–200)
HDL: 84.3 mg/dL (ref >39.00)
LDL Cholesterol: 170 mg/dL — ABNORMAL HIGH (ref 0–99)
NonHDL: 184.44
Total CHOL/HDL Ratio: 3
Triglycerides: 70 mg/dL (ref 0.0–149.0)
VLDL: 14 mg/dL (ref 0.0–40.0)

## 2019-06-29 LAB — CBC WITH DIFFERENTIAL/PLATELET
Basophils Absolute: 0 10*3/uL (ref 0.0–0.1)
Basophils Relative: 1.2 % (ref 0.0–3.0)
Eosinophils Absolute: 0.1 10*3/uL (ref 0.0–0.7)
Eosinophils Relative: 3.1 % (ref 0.0–5.0)
HCT: 39.6 % (ref 36.0–46.0)
Hemoglobin: 13.6 g/dL (ref 12.0–15.0)
Lymphocytes Relative: 35.3 % (ref 12.0–46.0)
Lymphs Abs: 1.3 10*3/uL (ref 0.7–4.0)
MCHC: 34.2 g/dL (ref 30.0–36.0)
MCV: 97.6 fl (ref 78.0–100.0)
Monocytes Absolute: 0.5 10*3/uL (ref 0.1–1.0)
Monocytes Relative: 12.5 % — ABNORMAL HIGH (ref 3.0–12.0)
Neutro Abs: 1.8 10*3/uL (ref 1.4–7.7)
Neutrophils Relative %: 47.9 % (ref 43.0–77.0)
Platelets: 207 10*3/uL (ref 150.0–400.0)
RBC: 4.06 Mil/uL (ref 3.87–5.11)
RDW: 12.7 % (ref 11.5–15.5)
WBC: 3.8 10*3/uL — ABNORMAL LOW (ref 4.0–10.5)

## 2019-06-29 LAB — MICROALBUMIN / CREATININE URINE RATIO
Creatinine,U: 18.2 mg/dL
Microalb Creat Ratio: 3.9 mg/g (ref 0.0–30.0)
Microalb, Ur: <0.7 mg/dL (ref 0.0–1.9)

## 2019-06-29 LAB — TSH: TSH: 1.64 u[IU]/mL (ref 0.35–4.50)

## 2019-06-29 LAB — VITAMIN D 25 HYDROXY (VIT D DEFICIENCY, FRACTURES): VITD: 79.16 ng/mL (ref 30.00–100.00)

## 2019-06-29 MED ORDER — METRONIDAZOLE 500 MG PO TABS
500.0000 mg | ORAL_TABLET | Freq: Two times a day (BID) | ORAL | 0 refills | Status: DC
Start: 2019-06-29 — End: 2019-12-30

## 2019-06-29 MED ORDER — HYDROXYZINE HCL 25 MG PO TABS: 25.0000 mg | ORAL_TABLET | Freq: Every evening | ORAL | 1 refills | Status: DC | PRN

## 2019-06-29 MED ORDER — LISINOPRIL 20 MG PO TABS
20.0000 mg | ORAL_TABLET | Freq: Every day | ORAL | 0 refills | Status: DC
Start: 2019-06-29 — End: 2019-09-07

## 2019-06-29 NOTE — Patient Instructions (Addendum)
For your blood pressure.Marland Kitchen it's so high. We are going to start you on lisinopril 20mg /day. Please keep a log for me and we will see you back in 1 month.  Dry cough let me know... swelling of lips go to ER.   For depression Lets stay the same since we are going to see if fixing blood pressure helps. We can always increase when you follow up.   Sleep Sent in medication called hydroxyzine. Can take as needed at night, but may make you drowsy.   BV Sent in flagyl. Can't drink alcohol on this.   Did referral to dr. . You could call office and get appointment.    Good to see you!  Dr. Orlan Leavens

## 2019-06-29 NOTE — Progress Notes (Signed)
Patient: Wendy Davies MRN: [id number] DOB: 03-May-1960 PCP: Orma Flaming, MD     Subjective:  Chief Complaint  Patient presents with  . Depression  . Hypertension  . Vaginal Discharge  . ortho referral    HPI: The patient is a 59 y.o. female who presents today for Hypertension and Depression. She did not bring her b/p log. She also has complaints of vaginal discharge with odor and needing a referral to ortho for her hand.   Depression She states she is doing better. She is no longer weeping. She is still very stressed. She is still having problems sleeping. She has no side effects of medication.   HTN She states it was high on all of her readings, but did not bring her cuff in. She is under a lot of stress and still thinks it is due to this.   Vaginal discharge/odor She has had BV in the past and this is presenting the same. It is foul smelling like fish. No itching or cottage cheese like   Appears to have dupyntrens contracture in her hand. Needs referral to ortho.   Review of Systems  Constitutional: Negative for fatigue and fever.  Eyes: Negative for visual disturbance.  Respiratory: Negative for cough, shortness of breath and wheezing.   Gastrointestinal: Negative for abdominal pain, diarrhea, nausea and vomiting.  Genitourinary: Positive for vaginal discharge. Negative for difficulty urinating, dyspareunia, dysuria, vaginal bleeding and vaginal pain.  Neurological: Negative for dizziness, light-headedness and headaches.  Psychiatric/Behavioral: Positive for sleep disturbance. Negative for suicidal ideas.    Allergies Patient is allergic to enbrel [etanercept]; morphine and related; pollen extract; and pork-derived products.  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  Surgical History Patient  has a past surgical history that includes Abdominal hysterectomy; Cesarean section; and Total hip arthroplasty (Right, 1997).  Family History Pateint's family history includes Arthritis/Rheumatoid in her mother; Diabetes in her brother and mother; Hypertension in her brother.  Social History Patient  reports that she has never smoked. She has never used smokeless tobacco. She reports current alcohol use. She reports that she does not use drugs.    Objective: Vitals:   06/29/19 0802 06/29/19 0825  BP: (!) 160/90 (!) 180/100  Pulse: 81   Temp: 97.7 F (36.5 C)   TempSrc: Temporal   SpO2: 99%   Weight: 136 lb 12.8 oz (62.1 kg)   Height: 4\' 10"  (1.473 m)     Body mass index is 28.59 kg/m.  Physical Exam Vitals reviewed.  Constitutional:      Appearance: Normal appearance. She is normal weight.  HENT:     Head: Normocephalic and atraumatic.  Cardiovascular:     Rate and Rhythm: Normal rate and regular rhythm.     Heart sounds: Normal heart sounds.  Pulmonary:     Effort: Pulmonary effort is normal.     Breath sounds: Normal breath sounds.  Abdominal:     General: Abdomen is flat. Bowel sounds are normal.     Palpations: Abdomen is soft.  Skin:    Capillary Refill: Capillary refill takes less than 2 seconds.  Neurological:     General: No focal deficit present.     Mental Status: She is  alert and oriented to person, place, and time.  Psychiatric:        Mood and Affect: Mood normal.        Behavior: Behavior normal.    EKG: NSR with rate of 66       Office Visit from 06/29/2019 in [facility]  PHQ-9 Total Score  7      Assessment/plan: 1. Annual physical exam Routine fasting labs today. Will do her  annual at next visit. No time today, but she is fasting so will get labs done.  - CBC with Differential/Platelet - Comprehensive metabolic panel - Lipid panel - TSH - VITAMIN D 25 Hydroxy (Vit-D Deficiency, Fractures)  2. Essential hypertension New diagnosis with multiple elevated readings over 2+ month time. I do wonder if her elevated readings are contributing to some of her physical symptoms. Baseline ekg/labs today. Starting her on lisinopril 20mg  and asked that she really try to keep a log for me. Will see her back in one months time. Side effects of ACE-I discussed including dry cough and angioedema. She is to call me if feels like they have a dry cough and they are to call 911 or go to ER if any signs/symptoms of angioedema.   - Microalbumin / creatinine urine ratio - EKG 12-Lead  3. Dupuytren contracture  - Ambulatory referral to Orthopedic Surgery  4. Bacterial vaginosis 7 day course of flagyl. Discussed she can not drink alcohol on this medication.     This visit occurred during the SARS-CoV-2 public health emergency.  Safety protocols were in place, including screening questions prior to the visit, additional usage of staff PPE, and extensive cleaning of exam room while observing appropriate contact time as indicated for disinfecting solutions.    Return in about 1 month (around 07/30/2019) for blood pressure check up. 08/01/2019, MD [place] [facility]   06/29/2019

## 2019-06-30 ENCOUNTER — Other Ambulatory Visit: Payer: Self-pay | Admitting: Family Medicine

## 2019-06-30 NOTE — Telephone Encounter (Signed)
Pt is requesting Fluoxetine 20mg  capsule #30 0 refills remaining  LOV: 5/54/2021 Next Visit: 07/27/2019  Approve?

## 2019-07-02 DIAGNOSIS — S63511A Sprain of carpal joint of right wrist, initial encounter: Secondary | ICD-10-CM | POA: Diagnosis not present

## 2019-07-02 DIAGNOSIS — M720 Palmar fascial fibromatosis [Dupuytren]: Secondary | ICD-10-CM | POA: Diagnosis not present

## 2019-07-02 DIAGNOSIS — M79641 Pain in right hand: Secondary | ICD-10-CM | POA: Diagnosis not present

## 2019-07-02 DIAGNOSIS — M25531 Pain in right wrist: Secondary | ICD-10-CM | POA: Diagnosis not present

## 2019-07-02 DIAGNOSIS — S63519A Sprain of carpal joint of unspecified wrist, initial encounter: Secondary | ICD-10-CM | POA: Insufficient documentation

## 2019-07-15 DIAGNOSIS — M79641 Pain in right hand: Secondary | ICD-10-CM | POA: Diagnosis not present

## 2019-07-16 DIAGNOSIS — R5383 Other fatigue: Secondary | ICD-10-CM | POA: Diagnosis not present

## 2019-07-16 DIAGNOSIS — L4059 Other psoriatic arthropathy: Secondary | ICD-10-CM | POA: Diagnosis not present

## 2019-07-16 DIAGNOSIS — L409 Psoriasis, unspecified: Secondary | ICD-10-CM | POA: Diagnosis not present

## 2019-07-16 DIAGNOSIS — M2550 Pain in unspecified joint: Secondary | ICD-10-CM | POA: Diagnosis not present

## 2019-07-23 DIAGNOSIS — M79641 Pain in right hand: Secondary | ICD-10-CM | POA: Diagnosis not present

## 2019-07-27 ENCOUNTER — Ambulatory Visit (INDEPENDENT_AMBULATORY_CARE_PROVIDER_SITE_OTHER): Payer: BC Managed Care – PPO | Admitting: Family Medicine

## 2019-07-27 ENCOUNTER — Encounter: Payer: Self-pay | Admitting: Family Medicine

## 2019-07-27 ENCOUNTER — Other Ambulatory Visit: Payer: Self-pay

## 2019-07-27 VITALS — BP 122/80 | HR 67 | Temp 98.1°F | Ht <= 58 in | Wt 135.0 lb

## 2019-07-27 DIAGNOSIS — E782 Mixed hyperlipidemia: Secondary | ICD-10-CM

## 2019-07-27 DIAGNOSIS — I10 Essential (primary) hypertension: Secondary | ICD-10-CM | POA: Diagnosis not present

## 2019-07-27 NOTE — Progress Notes (Signed)
Patient: Wendy Davies MRN: [id number] DOB: 1960/11/06 PCP: Orland Mustard, MD      Subjective:  Chief Complaint  Patient presents with  . Hypertension    1 month f/u  . Hyperlipidemia    HPI: The patient is a 59 y.o. female who presents today for Hypertension.  Hypertension Here for follow up of hypertension.  I started her on lisinopril 20mg  at her last visit due to multiple elevated readings.  Home readings range from 120-130 systolic/80 diastolic. Takes medication as prescribed and denies any side effects. Exercise includes walking. Weight has been stable. Denies any chest pain, headaches, shortness of breath, vision changes, swelling in lower extremities.   Hyperlipidemia  The 10-year ASCVD risk score DC Jr., et al., 2013) is: 3.4%   She really wants to work on diet/exercise and we will recheck in 6 months time.   Review of Systems  Constitutional: Negative for chills, fatigue and fever.  HENT: Negative for dental problem, ear pain, hearing loss and trouble swallowing.   Eyes: Negative for visual disturbance.  Respiratory: Negative for cough, chest tightness, shortness of breath and wheezing.   Cardiovascular: Negative for chest pain, palpitations and leg swelling.  Gastrointestinal: Negative for abdominal pain, blood in stool, diarrhea and nausea.  Endocrine: Negative for cold intolerance, polydipsia, polyphagia and polyuria.  Genitourinary: Negative for dysuria and hematuria.  Musculoskeletal: Negative for arthralgias.  Skin: Negative for rash.  Neurological: Negative for dizziness, light-headedness and headaches.  Psychiatric/Behavioral: Negative for dysphoric mood and sleep disturbance. The patient is not nervous/anxious.     Allergies Patient is allergic to enbrel [etanercept], morphine and related, pollen extract, and pork-derived products.  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  Surgical History Patient  has a past surgical history that includes Abdominal hysterectomy; Cesarean section; and Total hip arthroplasty (Right, 1997).  Family History Pateint's family history includes Arthritis/Rheumatoid in her mother; Diabetes in her brother and mother; Hypertension in her brother.  Social History Patient  reports that she has never smoked. She has never used smokeless tobacco. She reports current alcohol use. She reports that she does not use drugs.    Objective: Vitals:   07/27/19 0758  BP: 122/80  Pulse: 67  Temp: 98.1 F (36.7 C)  TempSrc: Temporal  SpO2: 99%  Weight: 135 lb (61.2 kg)  Height: 4\' 10"  (1.473 m)    Body mass index is 28.22 kg/m.  Physical Exam Vitals reviewed.  Constitutional:      Appearance: Normal appearance.  HENT:     Head: Normocephalic and atraumatic.  Cardiovascular:     Rate and Rhythm: Normal rate and regular rhythm.     Heart sounds: Normal heart sounds.  Pulmonary:     Effort: Pulmonary effort is normal.     Breath sounds: Normal breath sounds.  Abdominal:     General: Abdomen is flat. Bowel sounds are normal.     Palpations: Abdomen is soft.  Skin:    Capillary Refill: Capillary refill takes less than 2 seconds.  Neurological:     General: No focal deficit present.     Mental Status: She is alert and oriented to person, place, and time.  Psychiatric:  Mood and Affect: Mood normal.        Behavior: Behavior normal.        Assessment/plan: 1. Benign essential HTN Blood pressure is to goal. Continue current anti-hypertensive medications per hpi. Refills  Not given  and routine lab work will be done today. Recommended routine exercise and healthy diet including DASH diet and mediterranean diet. Encouraged weight loss. F/u in 12 months.    2. Mixed hyperlipidemia ascvd risk is right at cusp at 7.3%. she is going to work on diet/weight loss and exercise to see if she can bring this down. repeat fasting labs in 6 months time.  - Lipid panel; Future   This visit occurred during the SARS-CoV-2 public health emergency.  Safety protocols were in place, including screening questions prior to the visit, additional usage of staff PPE, and extensive cleaning of exam room while observing appropriate contact time as indicated for disinfecting solutions.     Return in about 1 year (around 07/26/2020) for annual/htn/anxiety .   Orma Flaming, MD [place]   07/27/2019

## 2019-07-27 NOTE — Patient Instructions (Signed)
Blood pressure is awesome!!!! Keep up medication and I don't need to see you for a year. If you notice your blood pressure lower than 110/60, break pill in half and let me know and keep watching it.   So proud of you! Enjoy your vacation!   Dr. Artis Flock

## 2019-08-24 DIAGNOSIS — Z20822 Contact with and (suspected) exposure to covid-19: Secondary | ICD-10-CM | POA: Diagnosis not present

## 2019-08-26 ENCOUNTER — Other Ambulatory Visit: Payer: Self-pay | Admitting: Family Medicine

## 2019-09-04 ENCOUNTER — Telehealth: Payer: Self-pay | Admitting: Family Medicine

## 2019-09-04 NOTE — Telephone Encounter (Signed)
Please advise on medication management

## 2019-09-04 NOTE — Telephone Encounter (Signed)
Patient is calling in asking if it would be possible to switch blood pressure medication, due to the bad cough she gets after taking it.

## 2019-09-07 MED ORDER — LOSARTAN POTASSIUM 50 MG PO TABS
50.0000 mg | ORAL_TABLET | Freq: Every day | ORAL | 3 refills | Status: DC
Start: 2019-09-07 — End: 2020-09-19

## 2019-09-07 NOTE — Telephone Encounter (Signed)
Patient aware of new med being sent to pharmacy. Aware to f/u in two weeks

## 2019-09-07 NOTE — Telephone Encounter (Signed)
Absolutely.  Let her know to stop the lisinopril. I put it in her allergy list. I sent in another drug called losartan at 50mg . It is pretty comparable dose, but she can always break in half for a few days and make sure blood pressure is not to low before going up to the full pill.  Let me know if any issues with new medication.   If after she has been on this x 2+ weeks would stop in for nurse visit only for recheck.   Dr. YTRZNB@

## 2019-09-07 NOTE — Addendum Note (Signed)
Addended by: Orland Mustard on: 09/07/2019 03:36 PM   Modules accepted: Orders

## 2019-09-15 DIAGNOSIS — S63511A Sprain of carpal joint of right wrist, initial encounter: Secondary | ICD-10-CM | POA: Diagnosis not present

## 2019-09-20 DIAGNOSIS — Z20822 Contact with and (suspected) exposure to covid-19: Secondary | ICD-10-CM | POA: Diagnosis not present

## 2019-10-13 DIAGNOSIS — S63511A Sprain of carpal joint of right wrist, initial encounter: Secondary | ICD-10-CM | POA: Diagnosis not present

## 2019-10-13 DIAGNOSIS — M720 Palmar fascial fibromatosis [Dupuytren]: Secondary | ICD-10-CM | POA: Diagnosis not present

## 2019-10-16 DIAGNOSIS — M2550 Pain in unspecified joint: Secondary | ICD-10-CM | POA: Diagnosis not present

## 2019-10-16 DIAGNOSIS — L4059 Other psoriatic arthropathy: Secondary | ICD-10-CM | POA: Diagnosis not present

## 2019-10-16 DIAGNOSIS — L409 Psoriasis, unspecified: Secondary | ICD-10-CM | POA: Diagnosis not present

## 2019-10-16 DIAGNOSIS — R5383 Other fatigue: Secondary | ICD-10-CM | POA: Diagnosis not present

## 2019-10-28 DIAGNOSIS — Z6827 Body mass index (BMI) 27.0-27.9, adult: Secondary | ICD-10-CM | POA: Diagnosis not present

## 2019-10-28 DIAGNOSIS — Z01419 Encounter for gynecological examination (general) (routine) without abnormal findings: Secondary | ICD-10-CM | POA: Diagnosis not present

## 2019-11-30 ENCOUNTER — Other Ambulatory Visit: Payer: Self-pay | Admitting: Family Medicine

## 2019-12-07 ENCOUNTER — Telehealth: Payer: Self-pay | Admitting: Family Medicine

## 2019-12-07 NOTE — Telephone Encounter (Signed)
Pts spouse Jonny Ruiz) is calling stating that the pt would like to transfer care to [facility] from Dr. Orland Mustard is it okay for the transfer?

## 2019-12-07 NOTE — Telephone Encounter (Signed)
Okay with me,  Dr. Artis Flock

## 2019-12-07 NOTE — Telephone Encounter (Signed)
Ok with me 

## 2019-12-08 NOTE — Telephone Encounter (Signed)
Pt was called and scheduled her TOC appt with Dr. Caryl Never

## 2019-12-30 ENCOUNTER — Ambulatory Visit (INDEPENDENT_AMBULATORY_CARE_PROVIDER_SITE_OTHER): Payer: BC Managed Care – PPO | Admitting: Family Medicine

## 2019-12-30 ENCOUNTER — Encounter: Payer: Self-pay | Admitting: Family Medicine

## 2019-12-30 ENCOUNTER — Other Ambulatory Visit: Payer: Self-pay

## 2019-12-30 VITALS — BP 140/90 | HR 76 | Temp 98.5°F | Ht <= 58 in | Wt 138.9 lb

## 2019-12-30 DIAGNOSIS — I10 Essential (primary) hypertension: Secondary | ICD-10-CM

## 2019-12-30 DIAGNOSIS — F419 Anxiety disorder, unspecified: Secondary | ICD-10-CM | POA: Diagnosis not present

## 2019-12-30 MED ORDER — BUSPIRONE HCL 7.5 MG PO TABS: 7.5000 mg | ORAL_TABLET | Freq: Two times a day (BID) | ORAL | 3 refills | Status: DC

## 2019-12-30 NOTE — Progress Notes (Signed)
Established Patient Office Visit  Subjective:  Patient ID: Wendy Davies, female    DOB: Oct 05, 1960  Age: 59 y.o. MRN: [id number]  CC:  Chief Complaint  Patient presents with   Establish Care    HPI Wendy Davies presents for establishing care.  She is currently [facility].  See her husband and several other family members.  Has history reviewed.  She has history of hypertension currently treated with losartan 25 mg daily.  She has history of osteoarthritis and psoriatic arthritis.  She is followed by rheumatology and is on Humira injections.  She has had prior right hip replacement.  She has had previous hysterectomy.  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  She has had some stress related to her daughter living in [place] and also family stress.  She states that her in-laws tend to be high strung and very anxious in this makes her feel anxious at times.  She drinks a couple cups of coffee in the morning but no other caffeine.  She is exercise regularly the past but not currently.  She denies any depression symptoms.  She was recently placed on Prozac per her previous primary care but she states that she felt too flat not emotional with that.  She is interested in exploring other potential options for treating her anxiety which is daily and pervasive.  She denies any symptoms to suggest panic disorder, social anxiety, obsessive-compulsive disorder, or generalized anxiety.  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/2016   HLD (hyperlipidemia) 10/15/2018   Impingement syndrome of right shoulder    Osteoarthritis of spine with radiculopathy, cervical region 08/28/2016   Psoriatic  arthritis ([place]) 08/26/2016   Sialadenitis     Past Surgical History:  Procedure Laterality Date   ABDOMINAL HYSTERECTOMY     CESAREAN SECTION     TOTAL HIP ARTHROPLASTY Right 1997   [employer]    Family History  Problem Relation Age of Onset   Diabetes Mother    Arthritis/Rheumatoid Mother    Diabetes Brother    Hypertension Brother     Social History   Socioeconomic History   Marital status: Married    Spouse name: Not on file   Number of children: Not on file   Years of education: Not on file   Highest education level: Not on file  Occupational History   Occupation: Herbalist: [employer]  Tobacco Use   Smoking status: Never Smoker   Smokeless tobacco: Never Used  Substance and Sexual Activity   Alcohol use: Yes   Drug use: No   Sexual activity: Yes  Other Topics Concern   Not on file  Social History Narrative   Not on file   Social Determinants of Health   Financial Resource Strain:    Difficulty of Paying Living Expenses: Not on file  Food Insecurity:    Worried About [place] in the Last Year: Not on file   Ran Out of Food in the Last Year: Not on file  Transportation Needs:    Lack of Transportation (Medical): Not on file   Lack of Transportation (Non-Medical):  Not on file  Physical Activity:    Days of Exercise per Week: Not on file   Minutes of Exercise per Session: Not on file  Stress:    Feeling of Stress : Not on file  Social Connections:    Frequency of Communication with Friends and Family: Not on file   Frequency of Social Gatherings with Friends and Family: Not on file   Attends Religious Services: Not on file   Active Member of [place] or Organizations: Not on file   Attends Archivist Meetings: Not on file   Marital Status: Not on file  Intimate Partner Violence:    Fear of Current or Ex-Partner: Not on file   Emotionally Abused: Not on file     Physically Abused: Not on file   Sexually Abused: Not on file    Outpatient Medications Prior to Visit  Medication Sig Dispense Refill   Adalimumab (HUMIRA PEN) 40 MG/0.4ML PNKT Humira(CF) Pen 40 mg/0.4 mL subcutaneous kit     DIVIGEL 1 MG/GM GEL   0   losartan (COZAAR) 50 MG tablet Take 1 tablet (50 mg total) by mouth daily. (Patient taking differently: 50 mg. Half tab daily) 90 tablet 3   nitroGLYCERIN (NITRODUR - DOSED IN MG/24 HR) 0.2 mg/hr patch SMARTSIG:0.25 Patch(s) Topical As Directed     gabapentin (NEURONTIN) 100 MG capsule Take 100-200 mg by mouth 3 (three) times daily.      hydrOXYzine (ATARAX/VISTARIL) 25 MG tablet Take 1 tablet (25 mg total) by mouth at bedtime as needed. 90 tablet 1   metroNIDAZOLE (FLAGYL) 500 MG tablet Take 1 tablet (500 mg total) by mouth 2 (two) times daily. 14 tablet 0   predniSONE (DELTASONE) 5 MG tablet prednisone 5 mg tablets in a dose pack  FPD     SIMPONI 50 MG/0.5ML SOAJ      triamcinolone cream (KENALOG) 0.1 % Apply 1 application topically 2 (two) times daily. 453.6 g 0   FLUoxetine (PROZAC) 20 MG capsule TAKE 1 CAPSULE(20 MG) BY MOUTH DAILY (Patient not taking: Reported on 12/30/2019) 180 capsule 1   No facility-administered medications prior to visit.    Allergies  Allergen Reactions   Enbrel [Etanercept]     Drops WBC   Lisinopril    Morphine And Related    Pollen Extract    Pork-Derived Products     ROS Review of Systems  Constitutional: Negative for fatigue.  Eyes: Negative for visual disturbance.  Respiratory: Negative for cough, chest tightness, shortness of breath and wheezing.   Cardiovascular: Negative for chest pain, palpitations and leg swelling.  Endocrine: Negative for polydipsia and polyuria.  Neurological: Negative for dizziness, seizures, syncope, weakness, light-headedness and headaches.  Psychiatric/Behavioral: Negative for dysphoric mood and sleep disturbance. The patient is nervous/anxious.        Objective:    Physical Exam Vitals reviewed.  Constitutional:      Appearance: Normal appearance.  Cardiovascular:     Rate and Rhythm: Normal rate and regular rhythm.  Pulmonary:     Effort: Pulmonary effort is normal.     Breath sounds: Normal breath sounds.  Musculoskeletal:     Cervical back: Neck supple.     Right lower leg: No edema.     Left lower leg: No edema.  Lymphadenopathy:     Cervical: No cervical adenopathy.  Neurological:     Mental Status: She is alert.     BP 140/90 (BP Location: Left Arm, Patient Position: Sitting, Cuff Size: Large)  Pulse 76    Temp 98.5 F (36.9 C) (Oral)    Ht 4' 10" (1.473 m)    Wt 138 lb 14.4 oz (63 kg)    LMP  (LMP Unknown)    SpO2 97%    BMI 29.03 kg/m  Wt Readings from Last 3 Encounters:  12/30/19 138 lb 14.4 oz (63 kg)  07/27/19 135 lb (61.2 kg)  06/29/19 136 lb 12.8 oz (62.1 kg)     There are no preventive care reminders to display for this patient.  There are no preventive care reminders to display for this patient.  Lab Results  Component Value Date   TSH 1.64 06/29/2019   Lab Results  Component Value Date   WBC 3.8 (L) 06/29/2019   HGB 13.6 06/29/2019   HCT 39.6 06/29/2019   MCV 97.6 06/29/2019   PLT 207.0 06/29/2019   Lab Results  Component Value Date   NA 138 06/29/2019   K 4.6 06/29/2019   CO2 30 06/29/2019   GLUCOSE 90 06/29/2019   BUN 14 06/29/2019   CREATININE 0.94 06/29/2019   BILITOT 0.6 06/29/2019   ALKPHOS 45 06/29/2019   AST 21 06/29/2019   ALT 18 06/29/2019   PROT 6.6 06/29/2019   ALBUMIN 4.6 06/29/2019   CALCIUM 9.7 06/29/2019   ANIONGAP 9 05/08/2014   GFR 60.83 06/29/2019   Lab Results  Component Value Date   CHOL 269 (H) 06/29/2019   Lab Results  Component Value Date   HDL 84.30 06/29/2019   Lab Results  Component Value Date   LDLCALC 170 (H) 06/29/2019   Lab Results  Component Value Date   TRIG 70.0 06/29/2019   Lab Results  Component Value Date   CHOLHDL 3  06/29/2019   No results found for: HGBA1C    Assessment & Plan:   #1 anxiety.  This appears to be largely situational  -We discussed nonpharmacologic management with adequate sleep, adequate exercise and consideration for behavioral intervention with counseling.  She will consider. -We agreed to try low-dose BuSpar 7.5 mg twice daily and reassess in 1 month -Minimize caffeine intake  #2 hypertension suboptimally controlled by today's reading.  Repeat left arm seated after rest 140/90  -She will work on trying to establish more consistent exercise and monitor closely at home.  Recheck in 1 month if still up at that point increase losartan to 50 mg daily  Meds ordered this encounter  Medications   busPIRone (BUSPAR) 7.5 MG tablet    Sig: Take 1 tablet (7.5 mg total) by mouth 2 (two) times daily.    Dispense:  60 tablet    Refill:  3    Follow-up: Return in about 1 month (around 01/29/2020).    Carolann Littler, MD

## 2019-12-30 NOTE — Patient Instructions (Signed)

## 2020-01-12 DIAGNOSIS — M7989 Other specified soft tissue disorders: Secondary | ICD-10-CM | POA: Diagnosis not present

## 2020-01-12 DIAGNOSIS — Z111 Encounter for screening for respiratory tuberculosis: Secondary | ICD-10-CM | POA: Diagnosis not present

## 2020-01-12 DIAGNOSIS — L4059 Other psoriatic arthropathy: Secondary | ICD-10-CM | POA: Diagnosis not present

## 2020-01-12 DIAGNOSIS — R5383 Other fatigue: Secondary | ICD-10-CM | POA: Diagnosis not present

## 2020-01-12 DIAGNOSIS — L409 Psoriasis, unspecified: Secondary | ICD-10-CM | POA: Diagnosis not present

## 2020-01-12 DIAGNOSIS — M2550 Pain in unspecified joint: Secondary | ICD-10-CM | POA: Diagnosis not present

## 2020-01-25 ENCOUNTER — Other Ambulatory Visit: Payer: BC Managed Care – PPO

## 2020-02-03 ENCOUNTER — Ambulatory Visit (INDEPENDENT_AMBULATORY_CARE_PROVIDER_SITE_OTHER): Payer: BC Managed Care – PPO | Admitting: Family Medicine

## 2020-02-03 ENCOUNTER — Other Ambulatory Visit: Payer: Self-pay

## 2020-02-03 ENCOUNTER — Encounter: Payer: Self-pay | Admitting: Family Medicine

## 2020-02-03 ENCOUNTER — Ambulatory Visit: Payer: BC Managed Care – PPO | Admitting: Family Medicine

## 2020-02-03 VITALS — BP 138/84 | HR 81 | Ht <= 58 in | Wt 139.0 lb

## 2020-02-03 DIAGNOSIS — M1990 Unspecified osteoarthritis, unspecified site: Secondary | ICD-10-CM | POA: Insufficient documentation

## 2020-02-03 DIAGNOSIS — F419 Anxiety disorder, unspecified: Secondary | ICD-10-CM | POA: Diagnosis not present

## 2020-02-03 NOTE — Progress Notes (Signed)
Established Patient Office Visit  Subjective:  Patient ID: Wendy Davies, female    DOB: [birth year]  Age: 59 y.o. MRN: [id number]  CC:  Chief Complaint  Patient presents with  . Follow-up    HPI [name] presents for follow-up regarding increased anxiety symptoms.  She had mostly situational anxiety.  Refer to previous note for details.  We elected to start low-dose BuSpar 7.5 mg twice daily.  She does feel this is helped significantly and she has not any significant side effects.  She is handling stress and anxiety symptoms much better.  She and her husband plan to go on a health kick early in the year with some dietary changes and increased exercise.  She denies any depression symptoms at this time.  Past Medical History:  Diagnosis Date  . Allergic rhinitis   . Bilateral carpal tunnel syndrome 08/28/2016  . Cervical radiculopathy 03/27/2016  . Gluten intolerance 07/26/2016  . 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 HLD (hyperlipidemia) 10/15/2018  . Impingement syndrome of right shoulder   . Osteoarthritis of spine with radiculopathy, cervical region 08/28/2016  . Psoriatic arthritis ([place]) 08/26/2016  . Sialadenitis     Past Surgical History:  Procedure Laterality Date  . ABDOMINAL HYSTERECTOMY    . CESAREAN SECTION    . TOTAL HIP ARTHROPLASTY Right 1997   [employer]    Family History  Problem Relation Age of Onset  . Diabetes Mother   . Arthritis/Rheumatoid Mother   . Diabetes Brother   . Hypertension Brother     Social History   Socioeconomic History  . Marital status: Married    Spouse name: Not on file  . Number of children: Not on file  . Years of education: Not on file  . Highest education level: Not on file  Occupational History  . Occupation: Herbalist: [employer]  Tobacco Use  . Smoking status: Never Smoker  . Smokeless tobacco: Never Used  Substance and Sexual Activity  . Alcohol use: Yes   . Drug use: No  . Sexual activity: Yes  Other Topics Concern  . Not on file  Social History Narrative  . Not on file   Social Determinants of Health   Financial Resource Strain: Not on file  Food Insecurity: Not on file  Transportation Needs: Not on file  Physical Activity: Not on file  Stress: Not on file  Social Connections: Not on file  Intimate Partner Violence: Not on file    Outpatient Medications Prior to Visit  Medication Sig Dispense Refill  . busPIRone (BUSPAR) 7.5 MG tablet Take 1 tablet (7.5 mg total) by mouth 2 (two) times daily. 60 tablet 3  . DIVIGEL 1 MG/GM GEL   0  . losartan (COZAAR) 50 MG tablet Take 1 tablet (50 mg total) by mouth daily. (Patient taking differently: 50 mg. Half tab daily) 90 tablet 3  . nitroGLYCERIN (NITRODUR - DOSED IN MG/24 HR) 0.2 mg/hr patch SMARTSIG:0.25 Patch(s) Topical As Directed    . TREMFYA 100 MG/ML SOPN Inject into the skin.    . Adalimumab (HUMIRA PEN) 40 MG/0.4ML PNKT Humira(CF) Pen 40 mg/0.4 mL subcutaneous kit     No facility-administered medications prior to visit.    Allergies  Allergen Reactions  . Enbrel [Etanercept]     Drops WBC  . Lisinopril   . Morphine And Related   . Pollen Extract   . Pork-Derived Products  ROS Review of Systems  Psychiatric/Behavioral: Negative for agitation and dysphoric mood.      Objective:    Physical Exam Vitals reviewed.  Constitutional:      Appearance: Normal appearance.  Cardiovascular:     Rate and Rhythm: Normal rate and regular rhythm.  Pulmonary:     Effort: Pulmonary effort is normal.     Breath sounds: Normal breath sounds.  Neurological:     General: No focal deficit present.     Mental Status: She is alert.     BP 138/84   Pulse 81   Ht 4' 10" (1.473 m)   Wt 139 lb (63 kg)   LMP  (LMP Unknown)   SpO2 97%   BMI 29.05 kg/m  Wt Readings from Last 3 Encounters:  02/03/20 139 lb (63 kg)  12/30/19 138 lb 14.4 oz (63 kg)  07/27/19 135 lb (61.2 kg)      Health Maintenance Due  Topic Date Due  . COVID-19 Vaccine (2 - Booster for Janssen series) 07/02/2019    There are no preventive care reminders to display for this patient.  Lab Results  Component Value Date   TSH 1.64 06/29/2019   Lab Results  Component Value Date   WBC 3.8 (L) 06/29/2019   HGB 13.6 06/29/2019   HCT 39.6 06/29/2019   MCV 97.6 06/29/2019   PLT 207.0 06/29/2019   Lab Results  Component Value Date   NA 138 06/29/2019   K 4.6 06/29/2019   CO2 30 06/29/2019   GLUCOSE 90 06/29/2019   BUN 14 06/29/2019   CREATININE 0.94 06/29/2019   BILITOT 0.6 06/29/2019   ALKPHOS 45 06/29/2019   AST 21 06/29/2019   ALT 18 06/29/2019   PROT 6.6 06/29/2019   ALBUMIN 4.6 06/29/2019   CALCIUM 9.7 06/29/2019   ANIONGAP 9 05/08/2014   GFR 60.83 06/29/2019   Lab Results  Component Value Date   CHOL 269 (H) 06/29/2019   Lab Results  Component Value Date   HDL 84.30 06/29/2019   Lab Results  Component Value Date   LDLCALC 170 (H) 06/29/2019   Lab Results  Component Value Date   TRIG 70.0 06/29/2019   Lab Results  Component Value Date   CHOLHDL 3 06/29/2019   No results found for: HGBA1C    Assessment & Plan:   Anxiety symptoms.  Suspect largely situational.  Improved with BuSpar.  Continue BuSpar 7.5 mg twice daily.  We mentioned that this could be titrated further if necessary down the road.  We discussed stress management with exercise and plenty of rest/sleep  No orders of the defined types were placed in this encounter.   Follow-up: No follow-ups on file.    Carolann Littler, MD

## 2020-02-15 ENCOUNTER — Encounter: Payer: BC Managed Care – PPO | Admitting: Family Medicine

## 2020-03-22 DIAGNOSIS — H02831 Dermatochalasis of right upper eyelid: Secondary | ICD-10-CM | POA: Diagnosis not present

## 2020-03-22 DIAGNOSIS — H02834 Dermatochalasis of left upper eyelid: Secondary | ICD-10-CM | POA: Diagnosis not present

## 2020-03-22 DIAGNOSIS — H2513 Age-related nuclear cataract, bilateral: Secondary | ICD-10-CM | POA: Diagnosis not present

## 2020-03-22 DIAGNOSIS — H524 Presbyopia: Secondary | ICD-10-CM | POA: Diagnosis not present

## 2020-03-22 DIAGNOSIS — H25013 Cortical age-related cataract, bilateral: Secondary | ICD-10-CM | POA: Diagnosis not present

## 2020-04-12 DIAGNOSIS — L409 Psoriasis, unspecified: Secondary | ICD-10-CM | POA: Diagnosis not present

## 2020-04-12 DIAGNOSIS — M2550 Pain in unspecified joint: Secondary | ICD-10-CM | POA: Diagnosis not present

## 2020-04-12 DIAGNOSIS — R5383 Other fatigue: Secondary | ICD-10-CM | POA: Diagnosis not present

## 2020-04-12 DIAGNOSIS — L4059 Other psoriatic arthropathy: Secondary | ICD-10-CM | POA: Diagnosis not present

## 2020-05-07 ENCOUNTER — Other Ambulatory Visit: Payer: Self-pay | Admitting: Family Medicine

## 2020-05-31 ENCOUNTER — Encounter: Payer: BC Managed Care – PPO | Admitting: Family Medicine

## 2020-06-28 ENCOUNTER — Other Ambulatory Visit: Payer: Self-pay

## 2020-06-28 ENCOUNTER — Encounter: Payer: Self-pay | Admitting: Family Medicine

## 2020-06-28 ENCOUNTER — Ambulatory Visit (INDEPENDENT_AMBULATORY_CARE_PROVIDER_SITE_OTHER): Payer: BC Managed Care – PPO | Admitting: Family Medicine

## 2020-06-28 VITALS — BP 120/70 | HR 73 | Temp 98.1°F | Ht <= 58 in | Wt 135.6 lb

## 2020-06-28 DIAGNOSIS — Z87891 Personal history of nicotine dependence: Secondary | ICD-10-CM

## 2020-06-28 DIAGNOSIS — Z0000 Encounter for general adult medical examination without abnormal findings: Secondary | ICD-10-CM

## 2020-06-28 LAB — BASIC METABOLIC PANEL
BUN: 13 mg/dL (ref 6–23)
CO2: 27 mEq/L (ref 19–32)
Calcium: 9.7 mg/dL (ref 8.4–10.5)
Chloride: 100 mEq/L (ref 96–112)
Creatinine, Ser: 0.96 mg/dL (ref 0.40–1.20)
GFR: 64.29 mL/min (ref >60.00)
Glucose, Bld: 85 mg/dL (ref 70–99)
Potassium: 4.3 mEq/L (ref 3.5–5.1)
Sodium: 138 mEq/L (ref 135–145)

## 2020-06-28 LAB — CBC WITH DIFFERENTIAL/PLATELET
Basophils Absolute: 0 10*3/uL (ref 0.0–0.1)
Basophils Relative: 0.9 % (ref 0.0–3.0)
Eosinophils Absolute: 0.1 10*3/uL (ref 0.0–0.7)
Eosinophils Relative: 1.7 % (ref 0.0–5.0)
HCT: 39.3 % (ref 36.0–46.0)
Hemoglobin: 13.2 g/dL (ref 12.0–15.0)
Lymphocytes Relative: 34.6 % (ref 12.0–46.0)
Lymphs Abs: 1.2 10*3/uL (ref 0.7–4.0)
MCHC: 33.7 g/dL (ref 30.0–36.0)
MCV: 95.5 fl (ref 78.0–100.0)
Monocytes Absolute: 0.4 10*3/uL (ref 0.1–1.0)
Monocytes Relative: 11.9 % (ref 3.0–12.0)
Neutro Abs: 1.7 10*3/uL (ref 1.4–7.7)
Neutrophils Relative %: 50.9 % (ref 43.0–77.0)
Platelets: 209 10*3/uL (ref 150.0–400.0)
RBC: 4.11 Mil/uL (ref 3.87–5.11)
RDW: 12.5 % (ref 11.5–15.5)
WBC: 3.3 10*3/uL — ABNORMAL LOW (ref 4.0–10.5)

## 2020-06-28 LAB — LIPID PANEL
Cholesterol: 250 mg/dL — ABNORMAL HIGH (ref 0–200)
HDL: 73.6 mg/dL (ref >39.00)
LDL Cholesterol: 155 mg/dL — ABNORMAL HIGH (ref 0–99)
NonHDL: 176.46
Total CHOL/HDL Ratio: 3
Triglycerides: 105 mg/dL (ref 0.0–149.0)
VLDL: 21 mg/dL (ref 0.0–40.0)

## 2020-06-28 LAB — HEPATIC FUNCTION PANEL
ALT: 14 U/L (ref 0–35)
AST: 17 U/L (ref 0–37)
Albumin: 4.4 g/dL (ref 3.5–5.2)
Alkaline Phosphatase: 42 U/L (ref 39–117)
Bilirubin, Direct: 0.1 mg/dL (ref 0.0–0.3)
Total Bilirubin: 0.6 mg/dL (ref 0.2–1.2)
Total Protein: 6.8 g/dL (ref 6.0–8.3)

## 2020-06-28 LAB — TSH: TSH: 2.81 u[IU]/mL (ref 0.35–4.50)

## 2020-06-28 NOTE — Progress Notes (Signed)
Established Patient Office Visit  Subjective:  Patient ID: Wendy Davies, female    DOB: February 24, 1960  Age: 60 y.o. MRN: [id number]  CC:  Chief Complaint  Patient presents with  . Annual Exam    No new concerns    HPI Wendy Davies presents for physical exam.  She does see gynecologist for Pap smears and is getting mammograms through their office and plans get follow-up mammogram this summer.  She is generally fairly healthy.  She does have history of hypertension which is treated with losartan.  She has history of psoriatic arthritis and is followed by rheumatology for that.  She quit smoking 11 years ago.  30-pack-year history.  She is not sure if she has had chickenpox previously.  No history of shingles vaccine.  Strong family history of coronary disease as below.  Father died of MI age 72 and mother had CAD and died of MI around age 43.  Social history-she is married and is a second marriage.  She has 2 children from first marriage and 2 stepchildren.  Quit smoking 11 years ago.  30-pack-year history.  No regular alcohol use.  Family history-mother had diabetes and died of MI at age 71.  Father had some type of congenital kidney disease and was on dialysis in his 95s and had MI and died of that at age 3.  She has 3 brothers and 1 sister.  1 brother who has morbid obesity and has type 2 diabetes and hypertension.    The 10-year ASCVD risk score Denman George DC Montez Hageman., et al., 2013) is: 3.7%   Values used to calculate the score:     Age: 43 years     Sex: Female     Is Non-Hispanic African American: No     Diabetic: No     Tobacco smoker: No     Systolic Blood Pressure: 120 mmHg     Is BP treated: Yes     HDL Cholesterol: 84.3 mg/dL     Total Cholesterol: 269 mg/dL   Past Medical History:  Diagnosis Date  . Allergic rhinitis   . Bilateral carpal tunnel syndrome 08/28/2016  . Cervical radiculopathy 03/27/2016  . Gluten intolerance 07/26/2016  . History of Perthes disease, right hip as  child 08/26/2016  . HLD (hyperlipidemia) 10/15/2018  . Impingement syndrome of right shoulder   . Osteoarthritis of spine with radiculopathy, cervical region 08/28/2016  . Psoriatic arthritis ([facility]) 08/26/2016  . Sialadenitis     Past Surgical History:  Procedure Laterality Date  . ABDOMINAL HYSTERECTOMY    . CESAREAN SECTION    . TOTAL HIP ARTHROPLASTY Right 1997   [employer]    Family History  Problem Relation Age of Onset  . Diabetes Mother   . Arthritis/Rheumatoid Mother   . Heart disease Mother 41       CAD  . Heart disease Father 39       MI  . Kidney disease Father   . Diabetes Brother   . Hypertension Brother     Social History   Socioeconomic History  . Marital status: Married    Spouse name: Not on file  . Number of children: Not on file  . Years of education: Not on file  . Highest education level: Not on file  Occupational History  . Occupation: Teaching laboratory technician: [employer]  Tobacco Use  . Smoking status: Never Smoker  . Smokeless tobacco: Never Used  Substance and  Sexual Activity  . Alcohol use: Yes  . Drug use: No  . Sexual activity: Yes  Other Topics Concern  . Not on file  Social History Narrative  . Not on file   Social Determinants of Health   Financial Resource Strain: Not on file  Food Insecurity: Not on file  Transportation Needs: Not on file  Physical Activity: Not on file  Stress: Not on file  Social Connections: Not on file  Intimate Partner Violence: Not on file    Outpatient Medications Prior to Visit  Medication Sig Dispense Refill  . busPIRone (BUSPAR) 7.5 MG tablet TAKE 1 TABLET(7.5 MG) BY MOUTH TWICE DAILY 60 tablet 1  . DIVIGEL 1 MG/GM GEL   0  . losartan (COZAAR) 50 MG tablet Take 1 tablet (50 mg total) by mouth daily. (Patient taking differently: 50 mg. Half tab daily) 90 tablet 3  . nitroGLYCERIN (NITRODUR - DOSED IN MG/24 HR) 0.2 mg/hr patch SMARTSIG:0.25 Patch(s) Topical As Directed    .  TREMFYA 100 MG/ML SOPN Inject into the skin.     No facility-administered medications prior to visit.    Allergies  Allergen Reactions  . Enbrel [Etanercept]     Drops WBC  . Lisinopril   . Morphine And Related   . Pollen Extract   . Pork-Derived Products     ROS Review of Systems  Constitutional: Negative for activity change, appetite change, fatigue, fever and unexpected weight change.  HENT: Negative for ear pain, hearing loss, sore throat and trouble swallowing.   Eyes: Negative for visual disturbance.  Respiratory: Negative for cough and shortness of breath.   Cardiovascular: Negative for chest pain and palpitations.  Gastrointestinal: Negative for abdominal pain, blood in stool, constipation and diarrhea.  Endocrine: Negative for polydipsia.  Genitourinary: Negative for dysuria and hematuria.  Musculoskeletal: Negative for arthralgias, back pain and myalgias.  Skin: Negative for rash.  Neurological: Negative for dizziness, syncope and headaches.  Hematological: Negative for adenopathy.  Psychiatric/Behavioral: Negative for confusion and dysphoric mood.      Objective:    Physical Exam Constitutional:      Appearance: She is well-developed.  HENT:     Head: Normocephalic and atraumatic.  Eyes:     Pupils: Pupils are equal, round, and reactive to light.  Neck:     Thyroid: No thyromegaly.  Cardiovascular:     Rate and Rhythm: Normal rate and regular rhythm.     Heart sounds: Normal heart sounds. No murmur heard.   Pulmonary:     Effort: No respiratory distress.     Breath sounds: Normal breath sounds. No wheezing or rales.  Abdominal:     General: Bowel sounds are normal. There is no distension.     Palpations: Abdomen is soft. There is no mass.     Tenderness: There is no abdominal tenderness. There is no guarding or rebound.  Genitourinary:    Comments: Per GYN Musculoskeletal:        General: Normal range of motion.     Cervical back: Normal range of  motion and neck supple.     Right lower leg: No edema.     Left lower leg: No edema.  Lymphadenopathy:     Cervical: No cervical adenopathy.  Skin:    Findings: No rash.  Neurological:     Mental Status: She is alert and oriented to person, place, and time.     Cranial Nerves: No cranial nerve deficit.     Deep Tendon  Reflexes: Reflexes normal.  Psychiatric:        Behavior: Behavior normal.        Thought Content: Thought content normal.        Judgment: Judgment normal.     BP 120/70 (BP Location: Left Arm, Patient Position: Sitting, Cuff Size: Normal)   Pulse 73   Temp 98.1 F (36.7 C) (Oral)   Ht 4\' 10"  (1.473 m)   Wt 135 lb 9.6 oz (61.5 kg)   LMP  (LMP Unknown)   SpO2 98%   BMI 28.34 kg/m  Wt Readings from Last 3 Encounters:  06/28/20 135 lb 9.6 oz (61.5 kg)  02/03/20 139 lb (63 kg)  12/30/19 138 lb 14.4 oz (63 kg)     Health Maintenance Due  Topic Date Due  . COVID-19 Vaccine (2 - Booster for Janssen series) 07/02/2019    There are no preventive care reminders to display for this patient.  Lab Results  Component Value Date   TSH 1.64 06/29/2019   Lab Results  Component Value Date   WBC 3.8 (L) 06/29/2019   HGB 13.6 06/29/2019   HCT 39.6 06/29/2019   MCV 97.6 06/29/2019   PLT 207.0 06/29/2019   Lab Results  Component Value Date   NA 138 06/29/2019   K 4.6 06/29/2019   CO2 30 06/29/2019   GLUCOSE 90 06/29/2019   BUN 14 06/29/2019   CREATININE 0.94 06/29/2019   BILITOT 0.6 06/29/2019   ALKPHOS 45 06/29/2019   AST 21 06/29/2019   ALT 18 06/29/2019   PROT 6.6 06/29/2019   ALBUMIN 4.6 06/29/2019   CALCIUM 9.7 06/29/2019   ANIONGAP 9 05/08/2014   GFR 60.83 06/29/2019   Lab Results  Component Value Date   CHOL 269 (H) 06/29/2019   Lab Results  Component Value Date   HDL 84.30 06/29/2019   Lab Results  Component Value Date   LDLCALC 170 (H) 06/29/2019   Lab Results  Component Value Date   TRIG 70.0 06/29/2019   Lab Results   Component Value Date   CHOLHDL 3 06/29/2019   No results found for: HGBA1C    Assessment & Plan:   Problem List Items Addressed This Visit   None   Visit Diagnoses    Physical exam    -  Primary   Relevant Orders   Basic metabolic panel   Lipid panel   CBC with Differential/Platelet   TSH   Hepatic function panel   Varicella Zoster Antibody, IgG    Discussed several health maintenance items  -Patient not sure regarding chickenpox status.  We will check varicella IgG antibody.  If positive will recommend Shingrix vaccine  -Recommend annual flu vaccine  -COVID vaccines up-to-date  -Colonoscopy up-to-date  -She will continue with GYN for yearly checkups and is getting every other year mammogram  -We discussed pros and cons of coronary calcium score and she would like to proceed with that  -We discussed low-dose CT lung cancer screening and she does seem to qualify-.  She is interested.  No orders of the defined types were placed in this encounter.   Follow-up: No follow-ups on file.    07/01/2019, MD

## 2020-06-28 NOTE — Patient Instructions (Signed)
Preventive Care 61-61 Years Old, Female Preventive care refers to lifestyle choices and visits with your health care provider that can promote health and wellness. This includes:  A yearly physical exam. This is also called an annual wellness visit.  Regular dental and eye exams.  Immunizations.  Screening for certain conditions.  Healthy lifestyle choices, such as: ? Eating a healthy diet. ? Getting regular exercise. ? Not using drugs or products that contain nicotine and tobacco. ? Limiting alcohol use. What can I expect for my preventive care visit? Physical exam Your health care provider will check your:  Height and weight. These may be used to calculate your BMI (body mass index). BMI is a measurement that tells if you are at a healthy weight.  Heart rate and blood pressure.  Body temperature.  Skin for abnormal spots. Counseling Your health care provider may ask you questions about your:  Past medical problems.  Family's medical history.  Alcohol, tobacco, and drug use.  Emotional well-being.  Home life and relationship well-being.  Sexual activity.  Diet, exercise, and sleep habits.  Work and work Statistician.  Access to firearms.  Method of birth control.  Menstrual cycle.  Pregnancy history. What immunizations do I need? Vaccines are usually given at various ages, according to a schedule. Your health care provider will recommend vaccines for you based on your age, medical history, and lifestyle or other factors, such as travel or where you work.   What tests do I need? Blood tests  Lipid and cholesterol levels. These may be checked every 5 years, or more often if you are over 3 years old.  Hepatitis C test.  Hepatitis B test. Screening  Lung cancer screening. You may have this screening every year starting at age 61 if you have a 30-pack-year history of smoking and currently smoke or have quit within the past 15 years.  Colorectal cancer  screening. ? All adults should have this screening starting at age 61 and continuing until age 17. ? Your health care provider may recommend screening at age 61 if you are at increased risk. ? You will have tests every 1-10 years, depending on your results and the type of screening test.  Diabetes screening. ? This is done by checking your blood sugar (glucose) after you have not eaten for a while (fasting). ? You may have this done every 1-3 years.  Mammogram. ? This may be done every 1-2 years. ? Talk with your health care provider about when you should start having regular mammograms. This may depend on whether you have a family history of breast cancer.  BRCA-related cancer screening. This may be done if you have a family history of breast, ovarian, tubal, or peritoneal cancers.  Pelvic exam and Pap test. ? This may be done every 3 years starting at age 61. ? Starting at age 61, this may be done every 5 years if you have a Pap test in combination with an HPV test. Other tests  STD (sexually transmitted disease) testing, if you are at risk.  Bone density scan. This is done to screen for osteoporosis. You may have this scan if you are at high risk for osteoporosis. Talk with your health care provider about your test results, treatment options, and if necessary, the need for more tests. Follow these instructions at home: Eating and drinking  Eat a diet that includes fresh fruits and vegetables, whole grains, lean protein, and low-fat dairy products.  Take vitamin and mineral supplements  as recommended by your health care provider.  Do not drink alcohol if: ? Your health care provider tells you not to drink. ? You are pregnant, may be pregnant, or are planning to become pregnant.  If you drink alcohol: ? Limit how much you have to 0-1 drink a day. ? Be aware of how much alcohol is in your drink. In the [place] one drink equals one 12 oz bottle of beer (355 mL), one 5 oz glass of  wine (148 mL), or one 1 oz glass of hard liquor (44 mL).   Lifestyle  Take daily care of your teeth and gums. Brush your teeth every morning and night with fluoride toothpaste. Floss one time each day.  Stay active. Exercise for at least 30 minutes 5 or more days each week.  Do not use any products that contain nicotine or tobacco, such as cigarettes, e-cigarettes, and chewing tobacco. If you need help quitting, ask your health care provider.  Do not use drugs.  If you are sexually active, practice safe sex. Use a condom or other form of protection to prevent STIs (sexually transmitted infections).  If you do not wish to become pregnant, use a form of birth control. If you plan to become pregnant, see your health care provider for a prepregnancy visit.  If told by your health care provider, take low-dose aspirin daily starting at age 7.  Find healthy ways to cope with stress, such as: ? Meditation, yoga, or listening to music. ? Journaling. ? Talking to a trusted person. ? Spending time with friends and family. Safety  Always wear your seat belt while driving or riding in a vehicle.  Do not drive: ? If you have been drinking alcohol. Do not ride with someone who has been drinking. ? When you are tired or distracted. ? While texting.  Wear a helmet and other protective equipment during sports activities.  If you have firearms in your house, make sure you follow all gun safety procedures. What's next?  Visit your health care provider once a year for an annual wellness visit.  Ask your health care provider how often you should have your eyes and teeth checked.  Stay up to date on all vaccines. This information is not intended to replace advice given to you by your health care provider. Make sure you discuss any questions you have with your health care provider. Document Revised: 10/27/2019 Document Reviewed: 10/03/2017 Elsevier Patient Education  2021 [place].  I  will order Coronary Calcium score and low CT lung cancer screen.

## 2020-06-29 LAB — VARICELLA ZOSTER ANTIBODY, IGG: Varicella IgG: 983.2 index

## 2020-07-07 ENCOUNTER — Telehealth: Payer: Self-pay | Admitting: Acute Care

## 2020-07-07 ENCOUNTER — Other Ambulatory Visit: Payer: Self-pay | Admitting: Family Medicine

## 2020-07-07 DIAGNOSIS — Z87891 Personal history of nicotine dependence: Secondary | ICD-10-CM

## 2020-07-07 NOTE — Telephone Encounter (Signed)
Spoke with patient Scheduled SDMV 08/10/20 10:00 CT order was placed Pt voiced understanding and had no further questions.

## 2020-07-13 DIAGNOSIS — M2550 Pain in unspecified joint: Secondary | ICD-10-CM | POA: Diagnosis not present

## 2020-07-13 DIAGNOSIS — L4059 Other psoriatic arthropathy: Secondary | ICD-10-CM | POA: Diagnosis not present

## 2020-07-13 DIAGNOSIS — R5383 Other fatigue: Secondary | ICD-10-CM | POA: Diagnosis not present

## 2020-07-13 DIAGNOSIS — L409 Psoriasis, unspecified: Secondary | ICD-10-CM | POA: Diagnosis not present

## 2020-07-14 DIAGNOSIS — H02831 Dermatochalasis of right upper eyelid: Secondary | ICD-10-CM | POA: Diagnosis not present

## 2020-07-14 DIAGNOSIS — H02834 Dermatochalasis of left upper eyelid: Secondary | ICD-10-CM | POA: Diagnosis not present

## 2020-07-25 DIAGNOSIS — M19031 Primary osteoarthritis, right wrist: Secondary | ICD-10-CM | POA: Diagnosis not present

## 2020-07-25 DIAGNOSIS — M25531 Pain in right wrist: Secondary | ICD-10-CM | POA: Diagnosis not present

## 2020-07-29 ENCOUNTER — Other Ambulatory Visit: Payer: Self-pay

## 2020-07-29 ENCOUNTER — Ambulatory Visit (INDEPENDENT_AMBULATORY_CARE_PROVIDER_SITE_OTHER)
Admission: RE | Admit: 2020-07-29 | Discharge: 2020-07-29 | Disposition: A | Discharge status: Home / Self Care | Payer: Self-pay | Source: Ambulatory Visit | Attending: Family Medicine | Admitting: Family Medicine

## 2020-07-29 DIAGNOSIS — R931 Abnormal findings on diagnostic imaging of heart and coronary circulation: Secondary | ICD-10-CM

## 2020-07-30 ENCOUNTER — Other Ambulatory Visit: Payer: Self-pay | Admitting: Family Medicine

## 2020-07-30 DIAGNOSIS — R931 Abnormal findings on diagnostic imaging of heart and coronary circulation: Secondary | ICD-10-CM

## 2020-07-30 NOTE — Progress Notes (Signed)
Very high coronary calcium score.  I am setting up cardiology referral and recommend starting Crestor 20 mg po qd and repeat lipids and hepatic in 2 months.

## 2020-08-09 DIAGNOSIS — M79641 Pain in right hand: Secondary | ICD-10-CM | POA: Diagnosis not present

## 2020-08-09 DIAGNOSIS — M19031 Primary osteoarthritis, right wrist: Secondary | ICD-10-CM | POA: Diagnosis not present

## 2020-08-09 DIAGNOSIS — M064 Inflammatory polyarthropathy: Secondary | ICD-10-CM | POA: Diagnosis not present

## 2020-08-09 DIAGNOSIS — M25531 Pain in right wrist: Secondary | ICD-10-CM | POA: Diagnosis not present

## 2020-08-10 ENCOUNTER — Encounter: Payer: Self-pay | Admitting: Acute Care

## 2020-08-10 ENCOUNTER — Ambulatory Visit (INDEPENDENT_AMBULATORY_CARE_PROVIDER_SITE_OTHER): Payer: BC Managed Care – PPO | Admitting: Acute Care

## 2020-08-10 ENCOUNTER — Ambulatory Visit
Admission: RE | Admit: 2020-08-10 | Discharge: 2020-08-10 | Disposition: A | Discharge status: Home / Self Care | Payer: BC Managed Care – PPO | Source: Ambulatory Visit | Attending: Acute Care | Admitting: Acute Care

## 2020-08-10 ENCOUNTER — Other Ambulatory Visit: Payer: Self-pay

## 2020-08-10 VITALS — BP 118/72 | HR 84 | Temp 97.3°F | Ht 58.5 in | Wt 133.4 lb

## 2020-08-10 DIAGNOSIS — Z87891 Personal history of nicotine dependence: Secondary | ICD-10-CM

## 2020-08-10 NOTE — Progress Notes (Signed)
Shared Decision Making Visit Lung Cancer Screening Program [phone])   Eligibility: Age 60 y.o. Pack Years Smoking History Calculation 25 pack year smoking history (# packs/per year x # years smoked) Recent History of coughing up blood  no Unexplained weight loss? no ( >Than 15 pounds within the last 6 months ) Prior History Lung / other cancer no (Diagnosis within the last 5 years already requiring surveillance chest CT Scans). Smoking Status Former Smoker Former Smokers: Years since quit: 13 years  Quit Date: 2009  Visit Components: Discussion included one or more decision making aids. yes Discussion included risk/benefits of screening. yes Discussion included potential follow up diagnostic testing for abnormal scans. yes Discussion included meaning and risk of over diagnosis. yes Discussion included meaning and risk of False Positives. yes Discussion included meaning of total radiation exposure. yes  Counseling Included: Importance of adherence to annual lung cancer LDCT screening. yes Impact of comorbidities on ability to participate in the program. yes Ability and willingness to under diagnostic treatment. yes  Smoking Cessation Counseling: Current Smokers:  Discussed importance of smoking cessation. yes Information about tobacco cessation classes and interventions provided to patient. yes Patient provided with "ticket" for LDCT Scan. yes Symptomatic Patient. no  Counseling Diagnosis Code: Tobacco Use Z72.0 Asymptomatic Patient yes  Counseling (Intermediate counseling: > three minutes counseling) C5852 Former Smokers:  Discussed the importance of maintaining cigarette abstinence. yes Diagnosis Code: Personal History of Nicotine Dependence. D78.242 Information about tobacco cessation classes and interventions provided to patient. Yes Patient provided with "ticket" for LDCT Scan. yes Written Order for Lung Cancer Screening with LDCT placed in Epic. Yes (CT Chest Lung  Cancer Screening Low Dose W/O CM) [id number] Z12.2-Screening of respiratory organs Z87.891-Personal history of nicotine dependence  I spent 25 minutes of face to face time with Ms. Neal discussing the risks and benefits of lung cancer screening. We viewed a power point together that explained in detail the above noted topics. We took the time to pause the power point at intervals to allow for questions to be asked and answered to ensure understanding. We discussed that she had taken the single most powerful action possible to decrease her risk of developing lung cancer when she quit smoking. I counseled her to remain smoke free, and to contact me if she ever had the desire to smoke again so that I can provide resources and tools to help support the effort to remain smoke free. We discussed the time and location of the scan, and that either  Abigail Miyamoto RN or I will call with the results within  24-48 hours of receiving them. She has my card and contact information in the event she needs to speak with me, in addition to a copy of the power point we reviewed as a resource. She verbalized understanding of all of the above and had no further questions upon leaving the office.     I explained to the patient that there has been a high incidence of coronary artery disease noted on these exams. I explained that this is a non-gated exam therefore degree or severity cannot be determined. This patient is not on statin therapy. I have asked the patient to follow-up with their PCP regarding any incidental finding of coronary artery disease and management with diet or medication as they feel is clinically indicated. The patient verbalized understanding of the above and had no further questions.      Bevelyn Ngo, NP 08/10/2020

## 2020-08-10 NOTE — Patient Instructions (Signed)
Thank you for participating in the [place] [facility]. It was our pleasure to meet you today. We will call you with the results of your scan within the next few days. Your scan will be assigned a Lung RADS category score by the physicians reading the scans.  This Lung RADS score determines follow up scanning.  See below for description of categories, and follow up screening recommendations. We will be in touch to schedule your follow up screening annually or based on recommendations of our providers. We will fax a copy of your scan results to your Primary Care Physician, or the physician who referred you to the program, to ensure they have the results. Please call the office if you have any questions or concerns regarding your scanning experience or results.  Our office number is [phone]. Please speak with Denise Phelps, RN. She is our Lung Cancer Screening RN. If she is unavailable when you call, please have the office staff send her a message. She will return your call at her earliest convenience. Remember, if your scan is normal, we will scan you annually as long as you continue to meet the criteria for the program. (Age 55-77, Current smoker or smoker who has quit within the last 15 years). If you are a smoker, remember, quitting is the single most powerful action that you can take to decrease your risk of lung cancer and other pulmonary, breathing related problems. We know quitting is hard, and we are here to help.  Please let us know if there is anything we can do to help you meet your goal of quitting. If you are a former smoker, congratulations. We are proud of you! Remain smoke free! Remember you can refer friends or family members through the number above.  We will screen them to make sure they meet criteria for the program. Thank you for helping us take better care of you by participating in Lung Screening.  Lung RADS Categories:  Lung RADS 1: no nodules  or definitely non-concerning nodules.  Recommendation is for a repeat annual scan in 12 months.  Lung RADS 2:  nodules that are non-concerning in appearance and behavior with a very low likelihood of becoming an active cancer. Recommendation is for a repeat annual scan in 12 months.  Lung RADS 3: nodules that are probably non-concerning , includes nodules with a low likelihood of becoming an active cancer.  Recommendation is for a 6-month repeat screening scan. Often noted after an upper respiratory illness. We will be in touch to make sure you have no questions, and to schedule your 6-month scan.  Lung RADS 4 A: nodules with concerning findings, recommendation is most often for a follow up scan in 3 months or additional testing based on our provider's assessment of the scan. We will be in touch to make sure you have no questions and to schedule the recommended 3 month follow up scan.  Lung RADS 4 B:  indicates findings that are concerning. We will be in touch with you to schedule additional diagnostic testing based on our provider's  assessment of the scan.   

## 2020-08-11 ENCOUNTER — Encounter: Payer: Self-pay | Admitting: *Deleted

## 2020-08-11 DIAGNOSIS — Z87891 Personal history of nicotine dependence: Secondary | ICD-10-CM

## 2020-08-11 NOTE — Progress Notes (Signed)
Please call patient and let them  know their  low dose Ct was read as a Lung RADS 1, negative study: no nodules or definitely benign nodules. Radiology recommendation is for a repeat LDCT in 12 months. .Please let them  know we will order and schedule their  annual screening scan for 08/2021. Please let them  know there was notation of CAD on their  scan.  Please remind the patient  that this is a non-gated exam therefore degree or severity of disease  cannot be determined. Please have them  follow up with their PCP regarding potential risk factor modification, dietary therapy or pharmacologic therapy if clinically indicated. Pt.  is not  currently on statin therapy. Please place order for annual  screening scan for  08/2021 and fax results to PCP. Thanks so much.  Angelique Blonder, they have significant CAD findings on LDCT. They need to follow up with PCP. No statin, cardiologist, echo or calcium CT noted in Epic.   Dr, Caryl Never, copying you on this for the notation of 3 vessel CAD, again, severity of this disease and any potential stenosis cannot be  assessed on this non-gated LD CT , but wanted to make sure you were aware. Thanks so much. Please do not hesitate to contact me with any questions or concerns.

## 2020-08-12 ENCOUNTER — Other Ambulatory Visit: Payer: Self-pay

## 2020-08-12 MED ORDER — ROSUVASTATIN CALCIUM 20 MG PO TABS: 20.0000 mg | ORAL_TABLET | Freq: Every day | ORAL | 3 refills | Status: DC

## 2020-08-12 NOTE — Progress Notes (Signed)
Spoke with the patient. She is aware of Dr. Burchette's message and will call back to schedule. Rx has been sent in.  

## 2020-09-01 ENCOUNTER — Ambulatory Visit: Payer: BC Managed Care – PPO | Admitting: Cardiology

## 2020-09-06 DIAGNOSIS — M06341 Rheumatoid nodule, right hand: Secondary | ICD-10-CM | POA: Diagnosis not present

## 2020-09-06 DIAGNOSIS — M064 Inflammatory polyarthropathy: Secondary | ICD-10-CM | POA: Diagnosis not present

## 2020-09-06 DIAGNOSIS — G5601 Carpal tunnel syndrome, right upper limb: Secondary | ICD-10-CM | POA: Diagnosis not present

## 2020-09-06 DIAGNOSIS — M19031 Primary osteoarthritis, right wrist: Secondary | ICD-10-CM | POA: Diagnosis not present

## 2020-09-11 ENCOUNTER — Other Ambulatory Visit: Payer: Self-pay | Admitting: Family Medicine

## 2020-09-18 ENCOUNTER — Other Ambulatory Visit: Payer: Self-pay | Admitting: Family Medicine

## 2020-09-19 ENCOUNTER — Other Ambulatory Visit: Payer: Self-pay | Admitting: Family Medicine

## 2020-10-18 NOTE — Progress Notes (Signed)
Chief Complaint  Patient presents with   New Patient (Initial Visit)    Abnormal coronary calcium score    History of Present Illness: 60 yo female with history of hyperlipidemia, HTN, psoriatic arthritis and former tobacco abuse who is here today as a new consult, referred by Dr. Caryl Never, for evaluation of an abnormal coronary calcium score. She has a strong family history of CAD. Her father had chronic kidney disease and had an MI at age 1. Her mother had an MI at age 66. Coronary calcium score of 802 by scan in June 2022. She tells me today that she feels well. She is very active and plays tennis. No smoking for 13 years. No chest pain, dyspnea, dizziness or LE edema.   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 90 tablet 3   Secukinumab (COSENTYX SENSOREADY PEN) 150 MG/ML SOAJ      No current facility-administered medications for this visit.    Allergies  Allergen Reactions   Bee Pollen Shortness Of Breath    Pork Allergy Anaphylaxis   Enbrel [Etanercept]     Drops WBC   Lisinopril    Morphine Other (See Comments)   Morphine And Related    Pollen Extract    Pork-Derived Products     Social History   Socioeconomic History   Marital status: Married    Spouse name: Not on file   Number of children: 2   Years of education: Not on file   Highest education level: Not on file  Occupational History   Occupation: Teaching laboratory technician: [employer]  Tobacco Use   Smoking status: Former    Packs/day: 1.00    Years: 30.00    Pack years: 30.00    Types: Cigarettes    Start date: 02/05/1977    Quit date: 05/05/2007    Years since quitting: 13.4   Smokeless tobacco: Never  Substance and Sexual Activity   Alcohol use: Yes   Drug use: No   Sexual activity: Yes  Other Topics Concern   Not on file  Social History Narrative   Not on file   Social Determinants of Health   Financial Resource Strain: Not on file  Food Insecurity: Not on file  Transportation Needs: Not on file  Physical Activity: Not on file  Stress: Not on file  Social Connections: Not on file  Intimate Partner Violence: Not  on file    Family History  Problem Relation Age of Onset   Diabetes Mother    Arthritis/Rheumatoid Mother    Heart disease Mother 65       CAD   Heart disease Father 35       MI   Kidney disease Father    Diabetes Brother    Hypertension Brother     Review of Systems:  As stated in the HPI and otherwise negative.   BP 136/90   Pulse 66   Ht 4' 10.5" (1.486 m)   Wt 132 lb 6.4 oz (60.1 kg)   LMP  (LMP Unknown)   SpO2 98%   BMI 27.20 kg/m   Physical Examination: General: Well developed, well nourished, NAD  HEENT: OP clear, mucus membranes moist  SKIN: warm, dry. No rashes. Neuro: No focal deficits  Musculoskeletal: Muscle strength 5/5 all ext  Psychiatric: Mood and affect normal  Neck: No JVD, no carotid bruits, no thyromegaly, no lymphadenopathy.  Lungs:Clear  bilaterally, no wheezes, rhonci, crackles Cardiovascular: Regular rate and rhythm. No murmurs, gallops or rubs. Abdomen:Soft. Bowel sounds present. Non-tender.  Extremities: No lower extremity edema. Pulses are 2 + in the bilateral DP/PT.  EKG:  EKG is ordered today. The ekg ordered today demonstrates sinus  Recent Labs: 06/28/2020: ALT 14; BUN 13; Creatinine, Ser 0.96; Hemoglobin 13.2; Platelets 209.0; Potassium 4.3; Sodium 138; TSH 2.81   Lipid Panel    Component Value Date/Time   CHOL 250 (H) 06/28/2020 0838   TRIG 105.0 06/28/2020 0838   HDL 73.60 06/28/2020 0838   CHOLHDL 3 06/28/2020 0838   VLDL 21.0 06/28/2020 0838   LDLCALC 155 (H) 06/28/2020 0838     Wt Readings from Last 3 Encounters:  10/19/20 132 lb 6.4 oz (60.1 kg)  08/10/20 133 lb 6.4 oz (60.5 kg)  06/28/20 135 lb 9.6 oz (61.5 kg)     Assessment and Plan:   1. CAD without angina: Evidence of coronary calcification on CT with calcium score of 802. Her risk factors for CAD are FH of CAD, prior tobacco abuse, HTN and HLD. She has not smoked in 13 years. She is now on therapy for her lipids and HTN. No symptoms. She is very active and plays tennis. I will have her continue the Crestor and start ASA 81 mg daily. Echo to assess LVEF and exclude structural heart disease. Exercise treadmill stress test.   2. Hyperlipidemia: Check lipids and LFTs now that she has been on Crestor for 3 months.   Current medicines are reviewed at length with the patient today.  The patient does not have concerns regarding medicines.  The following changes have been made:  no change  Labs/ tests ordered today include:   Orders Placed This Encounter  Procedures   Hepatic function panel   Lipid panel   EXERCISE TOLERANCE TEST (ETT)   EKG 12-Lead   ECHOCARDIOGRAM COMPLETE      Disposition:   FU with me in one year.     Signed, Verne Carrow, MD 10/19/2020 11:07 AM    [facility] [address] [place],  [place], [place]  [address] Phone: [phone]; Fax: [phone]

## 2020-10-19 ENCOUNTER — Ambulatory Visit (INDEPENDENT_AMBULATORY_CARE_PROVIDER_SITE_OTHER): Payer: BC Managed Care – PPO | Admitting: Cardiovascular Disease

## 2020-10-19 ENCOUNTER — Other Ambulatory Visit: Payer: Self-pay

## 2020-10-19 ENCOUNTER — Ambulatory Visit: Payer: BC Managed Care – PPO | Admitting: Cardiovascular Disease

## 2020-10-19 ENCOUNTER — Encounter: Payer: Self-pay | Admitting: Cardiovascular Disease

## 2020-10-19 VITALS — BP 136/90 | HR 66 | Ht 58.5 in | Wt 132.4 lb

## 2020-10-19 DIAGNOSIS — I2510 Atherosclerotic heart disease of native coronary artery without angina pectoris: Secondary | ICD-10-CM

## 2020-10-19 DIAGNOSIS — E7800 Pure hypercholesterolemia, unspecified: Secondary | ICD-10-CM

## 2020-10-19 NOTE — Patient Instructions (Signed)
Medication Instructions:  Your physician has recommended you make the following change in your medication:  1.) start aspirin (coated-EC) 81 mg - one tablet daily  *If you need a refill on your cardiac medications before your next appointment, please call your pharmacy*   Lab Work: When you return for ECHO, please also come fasting for labs. (Lipids, liver function)  If you have labs (blood work) drawn today and your tests are completely normal, you will receive your results only by: MyChart Message (if you have MyChart) OR A paper copy in the mail If you have any lab test that is abnormal or we need to change your treatment, we will call you to review the results.   Testing/Procedures: Your physician has requested that you have an echocardiogram. Echocardiography is a painless test that uses sound waves to create images of your heart. It provides your doctor with information about the size and shape of your heart and how well your heart's chambers and valves are working. This procedure takes approximately one hour. There are no restrictions for this procedure. Your physician has requested that you have an exercise tolerance test. For further information please visit [email or web address] Please also follow instruction sheet, as given.   Follow-Up: At [facility], you and your health needs are our priority.  As part of our continuing mission to provide you with exceptional heart care, we have created designated Provider Care Teams.  These Care Teams include your primary Cardiologist (physician) and Advanced Practice Providers (APPs -  Physician Assistants and Nurse Practitioners) who all work together to provide you with the care you need, when you need it.   Your next appointment:   12 month(s)  The format for your next appointment:   In Person  Provider:   You may see Verne Carrow or one of the following Advanced Practice Providers on your designated Care Team:   Ronie Spies, PA-C Jacolyn Reedy, PA-C   Other Instructions

## 2020-10-19 NOTE — Addendum Note (Signed)
Addended by: Verne Carrow D on: 10/19/2020 11:23 AM   Modules accepted: Orders

## 2020-10-19 NOTE — Addendum Note (Signed)
Addended by: Lendon Ka on: 10/19/2020 11:22 AM   Modules accepted: Orders

## 2020-10-25 ENCOUNTER — Ambulatory Visit (INDEPENDENT_AMBULATORY_CARE_PROVIDER_SITE_OTHER): Payer: BC Managed Care – PPO

## 2020-10-25 ENCOUNTER — Other Ambulatory Visit: Payer: Self-pay

## 2020-10-25 DIAGNOSIS — E7800 Pure hypercholesterolemia, unspecified: Secondary | ICD-10-CM

## 2020-10-25 DIAGNOSIS — I2510 Atherosclerotic heart disease of native coronary artery without angina pectoris: Secondary | ICD-10-CM | POA: Diagnosis not present

## 2020-10-26 LAB — EXERCISE TOLERANCE TEST
Angina Index: 0
Base ST Depression (mm): 0 mm
Duke Treadmill Score: 9
Estimated workload: 10.8
Exercise duration (min): 9 min
Exercise duration (sec): 28 s
MPHR: 160 {beats}/min
Peak HR: 153 {beats}/min
Percent HR: 95 %
RPE: 17
Rest HR: 64 {beats}/min
ST Depression (mm): 0 mm

## 2020-11-08 ENCOUNTER — Other Ambulatory Visit: Payer: Self-pay

## 2020-11-08 ENCOUNTER — Other Ambulatory Visit: Payer: BC Managed Care – PPO | Admitting: *Deleted

## 2020-11-08 ENCOUNTER — Ambulatory Visit (HOSPITAL_COMMUNITY): Payer: BC Managed Care – PPO | Attending: Cardiology

## 2020-11-08 DIAGNOSIS — I2510 Atherosclerotic heart disease of native coronary artery without angina pectoris: Secondary | ICD-10-CM | POA: Insufficient documentation

## 2020-11-08 DIAGNOSIS — E7800 Pure hypercholesterolemia, unspecified: Secondary | ICD-10-CM | POA: Diagnosis not present

## 2020-11-08 LAB — HEPATIC FUNCTION PANEL
ALT: 25 IU/L (ref 0–32)
AST: 24 IU/L (ref 0–40)
Albumin: 4.8 g/dL (ref 3.8–4.9)
Alkaline Phosphatase: 54 IU/L (ref 44–121)
Bilirubin Total: 0.5 mg/dL (ref 0.0–1.2)
Bilirubin, Direct: 0.13 mg/dL (ref 0.00–0.40)
Total Protein: 6.6 g/dL (ref 6.0–8.5)

## 2020-11-08 LAB — LIPID PANEL
Chol/HDL Ratio: 2.4 ratio (ref 0.0–4.4)
Cholesterol, Total: 175 mg/dL (ref 100–199)
HDL: 74 mg/dL (ref >39)
LDL Chol Calc (NIH): 89 mg/dL (ref 0–99)
Triglycerides: 61 mg/dL (ref 0–149)
VLDL Cholesterol Cal: 12 mg/dL (ref 5–40)

## 2020-11-08 LAB — ECHOCARDIOGRAM COMPLETE
Area-P 1/2: 3.03 cm2
P 1/2 time: 430 msec
S' Lateral: 2.3 cm

## 2020-11-10 ENCOUNTER — Telehealth: Payer: Self-pay | Admitting: *Deleted

## 2020-11-10 DIAGNOSIS — E7800 Pure hypercholesterolemia, unspecified: Secondary | ICD-10-CM

## 2020-11-10 DIAGNOSIS — I2510 Atherosclerotic heart disease of native coronary artery without angina pectoris: Secondary | ICD-10-CM

## 2020-11-10 MED ORDER — ROSUVASTATIN CALCIUM 40 MG PO TABS: 40.0000 mg | ORAL_TABLET | Freq: Every day | ORAL | 3 refills | Status: DC

## 2020-11-10 NOTE — Telephone Encounter (Signed)
-----   Message from Kathleene Hazel, MD sent at 11/09/2020  8:14 AM EDT ----- LDL down from 150 to 89 on Crestor 20. Goal LDL under 70. I would recommend increasing the crestor to 40 mg daily and repeating lipids/LFTS in 12 weeks. Thayer Ohm

## 2020-11-10 NOTE — Telephone Encounter (Signed)
Reviewed w patient who voices understanding.  She will increase Crestor to 40 mg daily and return for labs in 12 weeks.

## 2020-11-21 ENCOUNTER — Other Ambulatory Visit: Payer: Self-pay | Admitting: Family Medicine

## 2020-12-14 DIAGNOSIS — Z6827 Body mass index (BMI) 27.0-27.9, adult: Secondary | ICD-10-CM | POA: Diagnosis not present

## 2020-12-14 DIAGNOSIS — Z79890 Hormone replacement therapy (postmenopausal): Secondary | ICD-10-CM | POA: Diagnosis not present

## 2020-12-14 DIAGNOSIS — Z1231 Encounter for screening mammogram for malignant neoplasm of breast: Secondary | ICD-10-CM | POA: Diagnosis not present

## 2020-12-14 DIAGNOSIS — Z01419 Encounter for gynecological examination (general) (routine) without abnormal findings: Secondary | ICD-10-CM | POA: Diagnosis not present

## 2021-01-11 DIAGNOSIS — L409 Psoriasis, unspecified: Secondary | ICD-10-CM | POA: Diagnosis not present

## 2021-01-11 DIAGNOSIS — L4059 Other psoriatic arthropathy: Secondary | ICD-10-CM | POA: Diagnosis not present

## 2021-01-11 DIAGNOSIS — M150 Primary generalized (osteo)arthritis: Secondary | ICD-10-CM | POA: Diagnosis not present

## 2021-01-16 ENCOUNTER — Other Ambulatory Visit: Payer: Self-pay | Admitting: Family Medicine

## 2021-01-31 ENCOUNTER — Other Ambulatory Visit: Payer: BC Managed Care – PPO | Admitting: *Deleted

## 2021-01-31 ENCOUNTER — Other Ambulatory Visit: Payer: Self-pay

## 2021-01-31 DIAGNOSIS — I2510 Atherosclerotic heart disease of native coronary artery without angina pectoris: Secondary | ICD-10-CM | POA: Diagnosis not present

## 2021-01-31 DIAGNOSIS — E7800 Pure hypercholesterolemia, unspecified: Secondary | ICD-10-CM

## 2021-01-31 LAB — LIPID PANEL
Chol/HDL Ratio: 2.3 ratio (ref 0.0–4.4)
Cholesterol, Total: 181 mg/dL (ref 100–199)
HDL: 78 mg/dL (ref >39)
LDL Chol Calc (NIH): 88 mg/dL (ref 0–99)
Triglycerides: 82 mg/dL (ref 0–149)
VLDL Cholesterol Cal: 15 mg/dL (ref 5–40)

## 2021-01-31 LAB — HEPATIC FUNCTION PANEL
ALT: 39 IU/L — ABNORMAL HIGH (ref 0–32)
AST: 36 IU/L (ref 0–40)
Albumin: 4.8 g/dL (ref 3.8–4.8)
Alkaline Phosphatase: 55 IU/L (ref 44–121)
Bilirubin Total: 0.4 mg/dL (ref 0.0–1.2)
Bilirubin, Direct: 0.1 mg/dL (ref 0.00–0.40)
Total Protein: 6.6 g/dL (ref 6.0–8.5)

## 2021-02-01 ENCOUNTER — Telehealth: Payer: Self-pay | Admitting: *Deleted

## 2021-02-01 DIAGNOSIS — E7800 Pure hypercholesterolemia, unspecified: Secondary | ICD-10-CM

## 2021-02-01 DIAGNOSIS — Z79899 Other long term (current) drug therapy: Secondary | ICD-10-CM

## 2021-02-01 NOTE — Telephone Encounter (Signed)
-----   Message from Kathleene Hazel, MD sent at 02/01/2021  9:59 AM EST ----- We can repeat in 12 weeks once she has stabilized her diet. Thanks, chris ----- Message ----- From: Sampson Goon, RN Sent: 02/01/2021   9:10 AM EST To: Kathleene Hazel, MD, #  The patient has been notified of the result and verbalized understanding.  All questions (if any) were answered. Sampson Goon, RN 02/01/2021 9:06 AM    Patient is taking her Crestor daily, she wishes to hold off on starting an additional mediation. Will check with MD to see if he would like to recheck lipids and what timeframe. The patient would like to note that she has over indulged over the holiday and feels this the is cause for he reading.

## 2021-03-01 DIAGNOSIS — M19032 Primary osteoarthritis, left wrist: Secondary | ICD-10-CM | POA: Diagnosis not present

## 2021-03-01 DIAGNOSIS — M25531 Pain in right wrist: Secondary | ICD-10-CM | POA: Diagnosis not present

## 2021-03-01 DIAGNOSIS — M25532 Pain in left wrist: Secondary | ICD-10-CM | POA: Diagnosis not present

## 2021-03-01 DIAGNOSIS — M19031 Primary osteoarthritis, right wrist: Secondary | ICD-10-CM | POA: Diagnosis not present

## 2021-03-17 ENCOUNTER — Other Ambulatory Visit: Payer: Self-pay | Admitting: Family Medicine

## 2021-04-11 ENCOUNTER — Encounter: Payer: Self-pay | Admitting: Cardiovascular Disease

## 2021-04-11 DIAGNOSIS — I2510 Atherosclerotic heart disease of native coronary artery without angina pectoris: Secondary | ICD-10-CM

## 2021-04-11 DIAGNOSIS — E7800 Pure hypercholesterolemia, unspecified: Secondary | ICD-10-CM

## 2021-04-11 DIAGNOSIS — Z01812 Encounter for preprocedural laboratory examination: Secondary | ICD-10-CM

## 2021-04-11 NOTE — Telephone Encounter (Signed)
Called patient. ?Her calcium score ct was in 99%.  Her echo was normal and GXT was negative.  At that time she was playing tennis and having no symptoms.  She has not played tennis all winter because of her wrists.  She does walk and lift some weights and may have some SOB w this.  No chest pain.   She wants to have a coronary cta done as her family history is strong for CAD with her Mom having an MI and ultimately dying and did not present with symptoms.   ? ?Pt aware I am forwarding to Dr. Angelena Form for review and recommendations. ?

## 2021-04-12 ENCOUNTER — Encounter: Payer: Self-pay | Admitting: Cardiovascular Disease

## 2021-04-26 ENCOUNTER — Other Ambulatory Visit: Payer: BC Managed Care – PPO

## 2021-05-02 DIAGNOSIS — M79642 Pain in left hand: Secondary | ICD-10-CM | POA: Diagnosis not present

## 2021-05-02 DIAGNOSIS — M79641 Pain in right hand: Secondary | ICD-10-CM | POA: Diagnosis not present

## 2021-05-10 ENCOUNTER — Other Ambulatory Visit: Payer: BC Managed Care – PPO

## 2021-05-16 ENCOUNTER — Other Ambulatory Visit: Payer: Self-pay | Admitting: Family Medicine

## 2021-05-16 ENCOUNTER — Other Ambulatory Visit: Payer: BC Managed Care – PPO | Admitting: *Deleted

## 2021-05-16 DIAGNOSIS — Z79899 Other long term (current) drug therapy: Secondary | ICD-10-CM | POA: Diagnosis not present

## 2021-05-16 DIAGNOSIS — E7800 Pure hypercholesterolemia, unspecified: Secondary | ICD-10-CM

## 2021-05-16 LAB — LIPID PANEL
Chol/HDL Ratio: 2.3 ratio (ref 0.0–4.4)
Cholesterol, Total: 164 mg/dL (ref 100–199)
HDL: 70 mg/dL (ref >39)
LDL Chol Calc (NIH): 80 mg/dL (ref 0–99)
Triglycerides: 75 mg/dL (ref 0–149)
VLDL Cholesterol Cal: 14 mg/dL (ref 5–40)

## 2021-05-23 ENCOUNTER — Telehealth: Payer: Self-pay | Admitting: *Deleted

## 2021-05-23 MED ORDER — METOPROLOL TARTRATE 100 MG PO TABS: ORAL_TABLET | ORAL | 0 refills | Status: DC

## 2021-05-23 NOTE — Telephone Encounter (Signed)
I spoke with the patient.  We reviewed information about Zetia and adding to statin to get to goal.  She will consider this plan but not ready to start at this time.  ? ?She asked about being set up for coronary CTA.  Per Dr. Clifton James in MyChart message from 04/11/21 okay to order ct.  I reviewed this information with patient.  Reviewed instructions and sent to her in Mychart including taking metoprolol and having labs prior.  Lab orders placed and Lopressor sent to pharmacy. ?

## 2021-05-23 NOTE — Telephone Encounter (Signed)
Wendy Hazel, MD ?to Me   ? 10:19 AM ?We can set her up for a coronary CTA. She has not been seen since September 2022. I will be glad to see her back or we can set the test up for her. Thanks, chris  ? ? ?Spoke w the patient.  She is happy to do coronary CTA.  Reviewed instructions and will forward to her in MyChart. Will arrange BMET prior. ?

## 2021-05-23 NOTE — Telephone Encounter (Signed)
-----   Message from Kathleene Hazel, MD sent at 05/21/2021  9:11 AM EDT ----- ?LDL not at goal of 70 on Crestor 40 mg daily. Can we see if she is willing to add Zetia 10 mg daily to her regimen and continue Crestor? Repeat labs in 12 weeks. cdm ?

## 2021-06-01 DIAGNOSIS — R200 Anesthesia of skin: Secondary | ICD-10-CM | POA: Diagnosis not present

## 2021-06-01 DIAGNOSIS — G5611 Other lesions of median nerve, right upper limb: Secondary | ICD-10-CM | POA: Diagnosis not present

## 2021-06-01 DIAGNOSIS — R202 Paresthesia of skin: Secondary | ICD-10-CM | POA: Diagnosis not present

## 2021-06-15 ENCOUNTER — Telehealth: Payer: Self-pay

## 2021-06-15 ENCOUNTER — Other Ambulatory Visit: Payer: Self-pay | Admitting: Family Medicine

## 2021-06-15 ENCOUNTER — Telehealth: Payer: Self-pay | Admitting: Family Medicine

## 2021-06-15 DIAGNOSIS — M79641 Pain in right hand: Secondary | ICD-10-CM | POA: Diagnosis not present

## 2021-06-15 DIAGNOSIS — M79642 Pain in left hand: Secondary | ICD-10-CM | POA: Diagnosis not present

## 2021-06-15 NOTE — Telephone Encounter (Signed)
Pt requesting refill of losartan (COZAAR) 50 MG tablet to be sent to  ?[facility] [phone] - [place], [place] - [address] AT [facility] [address] Phone:  [phone]  ?Fax:  [phone]  ?  ?    pt states she was prescribed metoprolol tartrate (LOPRESSOR) 100 MG tablet by another provider and does not want to start it. Please advise.  ?

## 2021-06-15 NOTE — Telephone Encounter (Signed)
---  Caller states her allergies are horrible. Symptoms ?include sinus issues moving to her chest. Nasal ?congestion, throat is dry. Glands are swollen. Tested ?neg. for Covid yesterday. No fever. Claritin. Nyquil at ?night. ? ?06/15/2021 8:52:39 AM See PCP within 24 Hours [place], RN, Marylu Lund ? ?Comments ?User: Jason Coop, RN Date/Time Wendy Davies Time): 06/15/2021 8:56:11 AM ?caller disconnected while reaching backline. Info given ? ?Referrals ?Warm transfer to backline ? ?06/15/21 1116 - Offered pt appt with a provider today or tomorrow. Pt declines b/c she is headed out of town (states that's why she hung up before speaking with office staff). Pt states she has tried OTC allergy meds that have not been effective. Will try Xyzal; that she was trying to be proactive of getting it treated "before it settled in my chest like it always does.". Pt denies SOB, fever, or yellow/green mucous discharge. Pt states she will call office if anything is needed. ?

## 2021-06-15 NOTE — Telephone Encounter (Signed)
Losartan 50mg  tab refill previously sent.  See message below concerning Lopressor 100mg .   Please advise. ?

## 2021-06-16 NOTE — Telephone Encounter (Signed)
Patient aware refill for Losartan 50mg  has been sent to the pharmacy for 30 tabs due to yearly physical appointment needed.     Will call to schedule yearly physical appointment.    ?

## 2021-06-23 ENCOUNTER — Telehealth (HOSPITAL_COMMUNITY): Payer: Self-pay | Admitting: Emergency Medicine

## 2021-06-23 NOTE — Telephone Encounter (Signed)
Attempted to call patient regarding upcoming cardiac CT appointment. °Left message on voicemail with name and callback number °Julane Crock RN Navigator Cardiac Imaging °[facility] °[phone] Office °[phone] Cell ° °

## 2021-06-23 NOTE — Telephone Encounter (Signed)
Attempted to call patient regarding upcoming cardiac CT appointment. °Left message on voicemail with name and callback number °Easten Maceachern RN Navigator Cardiac Imaging °[facility] °[phone] Office °[phone] Cell ° °

## 2021-06-26 ENCOUNTER — Telehealth (HOSPITAL_COMMUNITY): Payer: Self-pay | Admitting: *Deleted

## 2021-06-26 NOTE — Telephone Encounter (Signed)
Patient returning call regarding upcoming cardiac imaging study; pt verbalizes understanding of appt date/time, parking situation and where to check in, pre-test NPO status and medications ordered, and verified current allergies; name and call back number provided for further questions should they arise  Larey Brick RN Navigator Cardiac Imaging [facility] [phone] office [phone] cell  Patient to take 100mg  metoprolol tartrate two hours prior to her cardiac CT scan.  She is aware to arrive at 9:30am.

## 2021-06-27 ENCOUNTER — Ambulatory Visit (HOSPITAL_COMMUNITY)
Admission: RE | Admit: 2021-06-27 | Discharge: 2021-06-27 | Disposition: A | Discharge status: Home / Self Care | Payer: BC Managed Care – PPO | Source: Ambulatory Visit | Attending: Cardiovascular Disease | Admitting: Cardiovascular Disease

## 2021-06-27 ENCOUNTER — Other Ambulatory Visit (HOSPITAL_COMMUNITY): Payer: Self-pay | Admitting: *Deleted

## 2021-06-27 ENCOUNTER — Ambulatory Visit (HOSPITAL_BASED_OUTPATIENT_CLINIC_OR_DEPARTMENT_OTHER)
Admission: RE | Admit: 2021-06-27 | Discharge: 2021-06-27 | Disposition: A | Discharge status: Home / Self Care | Payer: BC Managed Care – PPO | Source: Ambulatory Visit | Attending: Cardiovascular Disease | Admitting: Cardiovascular Disease

## 2021-06-27 ENCOUNTER — Other Ambulatory Visit (HOSPITAL_BASED_OUTPATIENT_CLINIC_OR_DEPARTMENT_OTHER): Payer: Self-pay | Admitting: Cardiovascular Disease

## 2021-06-27 DIAGNOSIS — I2510 Atherosclerotic heart disease of native coronary artery without angina pectoris: Secondary | ICD-10-CM

## 2021-06-27 DIAGNOSIS — R931 Abnormal findings on diagnostic imaging of heart and coronary circulation: Secondary | ICD-10-CM

## 2021-06-27 DIAGNOSIS — E7800 Pure hypercholesterolemia, unspecified: Secondary | ICD-10-CM | POA: Insufficient documentation

## 2021-06-27 DIAGNOSIS — Z01812 Encounter for preprocedural laboratory examination: Secondary | ICD-10-CM | POA: Diagnosis not present

## 2021-06-27 MED ORDER — NITROGLYCERIN 0.4 MG SL SUBL
0.8000 mg | SUBLINGUAL_TABLET | Freq: Once | SUBLINGUAL | Status: AC
  Administered 2021-06-27: 0.8 mg via SUBLINGUAL

## 2021-06-27 MED ORDER — IOHEXOL 350 MG/ML SOLN
95.0000 mL | Freq: Once | INTRAVENOUS | Status: AC | PRN
  Administered 2021-06-27: 95 mL via INTRAVENOUS

## 2021-06-27 MED ORDER — NITROGLYCERIN 0.4 MG SL SUBL
SUBLINGUAL_TABLET | SUBLINGUAL | Status: AC
  Filled 2021-06-27: qty 2

## 2021-06-29 ENCOUNTER — Telehealth: Payer: Self-pay | Admitting: *Deleted

## 2021-06-29 MED ORDER — EZETIMIBE 10 MG PO TABS: 10.0000 mg | ORAL_TABLET | Freq: Every day | ORAL | 3 refills | Status: DC

## 2021-06-29 NOTE — Telephone Encounter (Signed)
-----   Message from Kathleene Hazel, MD sent at 06/27/2021  1:42 PM EDT ----- Coronary CTA shows some plaque buildup with a moderate RCA stenosis and mild LAD stenosis with no lesion appearing to be flow limiting based on the negative CT FFR. This suggest flow down here arteries is normal. chris

## 2021-06-29 NOTE — Telephone Encounter (Signed)
Called patient and discussed.  She had wanted to wait on adding Zetia to see what this CT showed.  She is now ready to add Zetia 10 mg daily.  Prescription sent.

## 2021-07-05 ENCOUNTER — Encounter: Payer: BC Managed Care – PPO | Admitting: Family Medicine

## 2021-07-11 ENCOUNTER — Encounter: Payer: Self-pay | Admitting: Family Medicine

## 2021-07-11 ENCOUNTER — Ambulatory Visit (INDEPENDENT_AMBULATORY_CARE_PROVIDER_SITE_OTHER): Payer: BC Managed Care – PPO | Admitting: Family Medicine

## 2021-07-11 VITALS — BP 134/86 | HR 70 | Temp 97.7°F | Ht 58.47 in | Wt 136.6 lb

## 2021-07-11 DIAGNOSIS — Z0000 Encounter for general adult medical examination without abnormal findings: Secondary | ICD-10-CM

## 2021-07-11 NOTE — Patient Instructions (Signed)
Set up repeat mammogram ? ?Consider Shingrix vaccine.  ?

## 2021-07-11 NOTE — Progress Notes (Signed)
Established Patient Office Visit  Subjective   Patient ID: Wendy Davies, female    DOB: August 05, 1960  Age: 61 y.o. MRN: [id number]  Chief Complaint  Patient presents with   Annual Exam    HPI   Wendy Davies is seen for complete physical.  She still sees gynecologist for Pap smears.  She plans to get repeat mammogram soon.  She is getting these every other year.  She and her husband are getting ready to move to [facility] full-time.  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  Wendy Davies has history of hyperlipidemia and very strong family history of premature CAD.  She had coronary calcium score last year over 800 which is 99th percentile.  She had CT morphology study that showed no significant stenosis left main.  She had some involvement of the left anterior descending 25 to 49% and right coronary 50 to 69% proximal to mid RCA.  She has no chest pains.  She exercises regularly.  Tries to follow a low saturated fat diet  Health maintenance reviewed:  -Due for repeat mammogram at this time and this is already been scheduled -Prior hepatitis C screen negative -Tetanus due 2029 -Colonoscopy due 2027 -Declines shingles vaccine at this time but will consider this year  Family history and social history reviewed with no changes:  Social history-she is married and is a second marriage.  She has 2 children from first marriage and 2 stepchildren.  Quit smoking 11 years ago.  30-pack-year history.  No regular alcohol use.   Family history-mother had diabetes and died of MI at age 65.  Father had some type of congenital kidney disease and was on dialysis in his 42s and had MI and died of that at age 41.  She has 3 brothers and 1 sister.  1 brother who has morbid obesity and has type 2 diabetes and hypertension.  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 Her smoking use included cigarettes. She started smoking about 44 years ago. She has a 30.00 pack-year smoking history. She has never used smokeless tobacco. She reports current alcohol use. She reports that she does not use drugs. family history includes Arthritis/Rheumatoid in her mother; Diabetes in her brother and mother; Heart disease (age of onset: 35) in her father; Heart disease (age of onset: 72) in her mother; Hypertension in her brother; Kidney disease in her father. Allergies  Allergen Reactions   Pork Allergy Anaphylaxis   Enbrel [Etanercept]     Drops WBC   Lisinopril    Morphine Other (See Comments)   Morphine And Related    Pollen Extract    Pork-Derived Products       Review of Systems  Constitutional:  Negative for chills, fever, malaise/fatigue and weight loss.  HENT:  Negative for  hearing loss.   Eyes:  Negative for blurred vision and double vision.  Respiratory:  Negative for cough and shortness of breath.   Cardiovascular:  Negative for chest pain, palpitations and leg swelling.  Gastrointestinal:  Negative for abdominal pain, blood in stool, constipation and diarrhea.  Genitourinary:  Negative for dysuria.  Skin:  Negative for rash.  Neurological:  Negative for dizziness, speech change, seizures, loss of consciousness and headaches.  Psychiatric/Behavioral:  Negative for depression.      Objective:     BP 134/86 (BP Location: Left Arm, Patient  Position: Sitting, Cuff Size: Normal)   Pulse 70   Temp 97.7 F (36.5 C) (Oral)   Ht 4' 10.47" (1.485 m)   Wt 136 lb 9.6 oz (62 kg)   LMP  (LMP Unknown)   SpO2 97%   BMI 28.10 kg/m    Physical Exam Constitutional:      Appearance: She is well-developed.  HENT:     Head: Normocephalic and atraumatic.  Eyes:     Pupils: Pupils are equal, round, and reactive to light.  Neck:     Thyroid: No thyromegaly.  Cardiovascular:     Rate and Rhythm: Normal rate and regular rhythm.     Heart sounds: Normal heart sounds. No murmur heard. Pulmonary:     Effort: No respiratory distress.     Breath sounds: Normal breath sounds. No wheezing or rales.  Abdominal:     General: Bowel sounds are normal. There is no distension.     Palpations: Abdomen is soft. There is no mass.     Tenderness: There is no abdominal tenderness. There is no guarding or rebound.  Musculoskeletal:        General: Normal range of motion.     Cervical back: Normal range of motion and neck supple.     Right lower leg: No edema.     Left lower leg: No edema.  Lymphadenopathy:     Cervical: No cervical adenopathy.  Skin:    Findings: No rash.  Neurological:     Mental Status: She is alert and oriented to person, place, and time.     Cranial Nerves: No cranial nerve deficit.  Psychiatric:        Behavior: Behavior normal.        Thought Content: Thought content normal.        Judgment: Judgment normal.     No results found for any visits on 07/11/21.    The 10-year ASCVD risk score (Arnett DK, et al., 2019) is: 4%    Assessment & Plan:   Problem List Items Addressed This Visit   None Visit Diagnoses     Physical exam    -  Primary     History of hyperlipidemia and positive family history of premature CAD.  Abnormal CT morphology study as above.  Stressed importance of aggressive lipid treatment and continue daily exercise along with low saturated fat diet.  -She had recent lipid and hepatic panel.   We decided to forego other labs this time since these were not due at this time.  Consider repeat full panel by next year. -We did discuss shingles vaccine and she will consider at some point later this year but declines today -Continue annual flu vaccine  No follow-ups on file.    Carolann Littler, MD

## 2021-07-15 ENCOUNTER — Other Ambulatory Visit: Payer: Self-pay | Admitting: Family Medicine

## 2021-07-21 DIAGNOSIS — S63591A Other specified sprain of right wrist, initial encounter: Secondary | ICD-10-CM | POA: Diagnosis not present

## 2021-07-21 DIAGNOSIS — G5621 Lesion of ulnar nerve, right upper limb: Secondary | ICD-10-CM | POA: Diagnosis not present

## 2021-07-21 DIAGNOSIS — G5601 Carpal tunnel syndrome, right upper limb: Secondary | ICD-10-CM | POA: Diagnosis not present

## 2021-07-21 DIAGNOSIS — Y999 Unspecified external cause status: Secondary | ICD-10-CM | POA: Diagnosis not present

## 2021-07-21 DIAGNOSIS — X58XXXA Exposure to other specified factors, initial encounter: Secondary | ICD-10-CM | POA: Diagnosis not present

## 2021-07-25 DIAGNOSIS — H6123 Impacted cerumen, bilateral: Secondary | ICD-10-CM | POA: Diagnosis not present

## 2021-07-25 DIAGNOSIS — M79641 Pain in right hand: Secondary | ICD-10-CM | POA: Diagnosis not present

## 2021-07-25 DIAGNOSIS — H903 Sensorineural hearing loss, bilateral: Secondary | ICD-10-CM | POA: Diagnosis not present

## 2021-07-26 ENCOUNTER — Other Ambulatory Visit: Payer: Self-pay | Admitting: Family Medicine

## 2021-08-09 ENCOUNTER — Telehealth: Payer: Self-pay

## 2021-08-09 NOTE — Telephone Encounter (Signed)
Pt states she only gets mammograms every other year due to not having a family history for breast cancer. Pt states she gets them through her GYN Dr Marcelle Overlie & has one scheduled for Aug (she is unable to provide the date at this time). Advised that I will call back in Aug to see when we can request the records & pt consents/verb understanding.

## 2021-08-10 ENCOUNTER — Ambulatory Visit
Admission: RE | Admit: 2021-08-10 | Discharge: 2021-08-10 | Disposition: A | Discharge status: Home / Self Care | Payer: BC Managed Care – PPO | Source: Ambulatory Visit | Attending: Family Medicine | Admitting: Family Medicine

## 2021-08-10 DIAGNOSIS — Z87891 Personal history of nicotine dependence: Secondary | ICD-10-CM

## 2021-08-10 DIAGNOSIS — I2510 Atherosclerotic heart disease of native coronary artery without angina pectoris: Secondary | ICD-10-CM | POA: Diagnosis not present

## 2021-08-10 DIAGNOSIS — D7389 Other diseases of spleen: Secondary | ICD-10-CM | POA: Diagnosis not present

## 2021-08-10 DIAGNOSIS — K831 Obstruction of bile duct: Secondary | ICD-10-CM | POA: Diagnosis not present

## 2021-08-11 ENCOUNTER — Encounter: Payer: Self-pay | Admitting: Cardiovascular Disease

## 2021-08-14 ENCOUNTER — Telehealth: Payer: Self-pay | Admitting: Acute Care

## 2021-08-14 ENCOUNTER — Other Ambulatory Visit: Payer: Self-pay

## 2021-08-14 DIAGNOSIS — Z87891 Personal history of nicotine dependence: Secondary | ICD-10-CM

## 2021-08-14 DIAGNOSIS — Z122 Encounter for screening for malignant neoplasm of respiratory organs: Secondary | ICD-10-CM

## 2021-08-14 NOTE — Telephone Encounter (Signed)
Spoke with patient by phone to review results of LDCT.  Patient has reviewed results in mychart.  No suspicious finding for lung cancer. Atherosclerosis and emphysema, as previously noted.  New findings of dilated bile duct which may be due to obstruction, possible gallstones.  Informed patient that results with a note regarding attention to bile duct has been faxed to PCP.  Patient advised to follow up with PCP for further recommendations if she has not heard from them by first of next week. Patient acknowledged understanding and had no further questions.

## 2021-08-28 DIAGNOSIS — M79642 Pain in left hand: Secondary | ICD-10-CM | POA: Diagnosis not present

## 2021-08-28 DIAGNOSIS — M79641 Pain in right hand: Secondary | ICD-10-CM | POA: Diagnosis not present

## 2021-08-28 DIAGNOSIS — Z4889 Encounter for other specified surgical aftercare: Secondary | ICD-10-CM | POA: Diagnosis not present

## 2021-08-28 DIAGNOSIS — R200 Anesthesia of skin: Secondary | ICD-10-CM | POA: Diagnosis not present

## 2021-09-18 DIAGNOSIS — R200 Anesthesia of skin: Secondary | ICD-10-CM | POA: Diagnosis not present

## 2021-09-18 DIAGNOSIS — M79641 Pain in right hand: Secondary | ICD-10-CM | POA: Diagnosis not present

## 2021-09-18 DIAGNOSIS — M79642 Pain in left hand: Secondary | ICD-10-CM | POA: Diagnosis not present

## 2021-09-18 DIAGNOSIS — Z4889 Encounter for other specified surgical aftercare: Secondary | ICD-10-CM | POA: Diagnosis not present

## 2021-10-12 DIAGNOSIS — M25631 Stiffness of right wrist, not elsewhere classified: Secondary | ICD-10-CM | POA: Diagnosis not present

## 2021-10-12 DIAGNOSIS — M79642 Pain in left hand: Secondary | ICD-10-CM | POA: Diagnosis not present

## 2021-10-12 DIAGNOSIS — M79641 Pain in right hand: Secondary | ICD-10-CM | POA: Diagnosis not present

## 2021-10-12 DIAGNOSIS — Z4889 Encounter for other specified surgical aftercare: Secondary | ICD-10-CM | POA: Diagnosis not present

## 2021-10-13 DIAGNOSIS — H903 Sensorineural hearing loss, bilateral: Secondary | ICD-10-CM | POA: Diagnosis not present

## 2021-10-16 ENCOUNTER — Other Ambulatory Visit: Payer: Self-pay

## 2021-10-16 MED ORDER — ROSUVASTATIN CALCIUM 40 MG PO TABS: 40.0000 mg | ORAL_TABLET | Freq: Every day | ORAL | 0 refills | Status: DC

## 2021-10-18 ENCOUNTER — Other Ambulatory Visit: Payer: Self-pay | Admitting: *Deleted

## 2021-10-31 ENCOUNTER — Encounter: Payer: Self-pay | Admitting: Cardiovascular Disease

## 2021-11-07 DIAGNOSIS — H25813 Combined forms of age-related cataract, bilateral: Secondary | ICD-10-CM | POA: Diagnosis not present

## 2021-11-07 DIAGNOSIS — H524 Presbyopia: Secondary | ICD-10-CM | POA: Diagnosis not present

## 2021-11-19 NOTE — Progress Notes (Unsigned)
No chief complaint on file.  History of Present Illness: 61 yo female with history of hyperlipidemia, HTN, psoriatic arthritis and former tobacco abuse who is here today for cardiac follow up. I saw her as a new consult in September 2022 to establish cardiac care and discuss an abnormal coronary calcium score. She has a strong family history of CAD. Her father had chronic kidney disease and had an MI at age 23. Her mother had an MI at age 71. Coronary calcium score of 802 by scan in June 2022. She had no complaints at her first visit here. Exercise stress test in September 2022 with no ischemia. Echo October 2022 with LVEF=60%. No significant valve disease. Coronary CTA May 2023 with moderate RCA stenosis with negative FFR. Mild plaque in the LAD and Circumflex.   She is here today for follow up. The patient denies any chest pain, dyspnea, palpitations, lower extremity edema, orthopnea, PND, dizziness, near syncope or syncope.   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/2016   HLD (hyperlipidemia) 10/15/2018   HTN (hypertension)    Impingement syndrome of right shoulder    Osteoarthritis of spine with radiculopathy, cervical region 08/28/2016   Psoriatic arthritis ([place]) 08/26/2016   Sialadenitis     Past Surgical History:  Procedure Laterality Date   ABDOMINAL HYSTERECTOMY     CESAREAN SECTION     TOTAL HIP ARTHROPLASTY Right 1997   Titanium and Ceramic    Current Outpatient Medications  Medication Sig Dispense Refill   busPIRone (BUSPAR) 7.5 MG tablet TAKE 1 TABLET(7.5 MG) BY MOUTH TWICE DAILY 180 tablet 1   DIVIGEL 1 MG/GM GEL   0   ezetimibe (ZETIA) 10 MG tablet Take 1 tablet (10 mg total) by mouth daily. 90 tablet 3   losartan (COZAAR) 50  MG tablet TAKE 1 TABLET(50 MG) BY MOUTH DAILY 90 tablet 3   rosuvastatin (CRESTOR) 40 MG tablet Take 1 tablet (40 mg total) by mouth daily. 30 tablet 0   Secukinumab (COSENTYX SENSOREADY PEN) 150 MG/ML SOAJ      No current facility-administered medications for this visit.    Allergies  Allergen Reactions   Pork Allergy Anaphylaxis   Enbrel [Etanercept]     Drops WBC   Lisinopril    Morphine Other (See Comments)   Morphine And Related    Pollen Extract    Pork-Derived Products     Social History   Socioeconomic History   Marital status: Married    Spouse name: Not on file   Number of children: 2   Years of education: Not on file   Highest education level: Not on file  Occupational History   Occupation: Herbalist: [employer]  Tobacco Use   Smoking status: Former    Packs/day: 1.00    Years: 30.00    Total pack years: 30.00    Types: Cigarettes    Start date: 02/05/1977    Quit date: 05/05/2007    Years since quitting: 14.5   Smokeless tobacco: Never  Substance and Sexual Activity   Alcohol use: Yes   Drug use: No   Sexual activity: Yes  Other Topics Concern   Not on file  Social History Narrative  Not on file   Social Determinants of Health   Financial Resource Strain: Not on file  Food Insecurity: Not on file  Transportation Needs: Not on file  Physical Activity: Not on file  Stress: Not on file  Social Connections: Not on file  Intimate Partner Violence: Not on file    Family History  Problem Relation Age of Onset   Diabetes Mother    Arthritis/Rheumatoid Mother    Heart disease Mother 79       CAD   Heart disease Father 22       MI   Kidney disease Father    Diabetes Brother    Hypertension Brother     Review of Systems:  As stated in the HPI and otherwise negative.   LMP  (LMP Unknown)   Physical Examination: General: Well developed, well nourished, NAD  HEENT: OP clear, mucus membranes moist  SKIN: warm,  dry. No rashes. Neuro: No focal deficits  Musculoskeletal: Muscle strength 5/5 all ext  Psychiatric: Mood and affect normal  Neck: No JVD, no carotid bruits, no thyromegaly, no lymphadenopathy.  Lungs:Clear bilaterally, no wheezes, rhonci, crackles Cardiovascular: Regular rate and rhythm. No murmurs, gallops or rubs. Abdomen:Soft. Bowel sounds present. Non-tender.  Extremities: No lower extremity edema. Pulses are 2 + in the bilateral DP/PT.   EKG:  EKG is *** ordered today. The ekg ordered today demonstrates   Echo October 2022:  1. Left ventricular ejection fraction by 3D volume is 61 %. The left  ventricle has normal function. The left ventricle has no regional wall  motion abnormalities. There is mild concentric left ventricular  hypertrophy. Left ventricular diastolic  parameters are consistent with Grade I diastolic dysfunction (impaired  relaxation).   2. Right ventricular systolic function is normal. The right ventricular  size is normal.   3. The mitral valve is normal in structure. Trivial mitral valve  regurgitation. No evidence of mitral stenosis.   4. The aortic valve is normal in structure. Aortic valve regurgitation is  trivial. No aortic stenosis is present.   5. The inferior vena cava is normal in size with greater than 50%  respiratory variability, suggesting right atrial pressure of 3 mmHg.   Recent Labs: 01/31/2021: ALT 39   Lipid Panel    Component Value Date/Time   CHOL 164 05/16/2021 0731   TRIG 75 05/16/2021 0731   HDL 70 05/16/2021 0731   CHOLHDL 2.3 05/16/2021 0731   CHOLHDL 3 06/28/2020 0838   VLDL 21.0 06/28/2020 0838   LDLCALC 80 05/16/2021 0731     Wt Readings from Last 3 Encounters:  07/11/21 136 lb 9.6 oz (62 kg)  10/19/20 132 lb 6.4 oz (60.1 kg)  08/10/20 133 lb 6.4 oz (60.5 kg)     Assessment and Plan:   1. CAD without angina: Moderate RCA stenosis by coronary CTA in May 2023. No chest pain. Continue ASA and statin.   2.  Hyperlipidemia: Last LDL 80 in April 2023. *** Continue statin and Zetia. Zetia started since last LDL was checked. *** Check lipids and LFTS today.   Labs/ tests ordered today include:   No orders of the defined types were placed in this encounter.   Disposition:   FU with me in one year.     Signed, Verne Carrow, MD 11/19/2021 12:50 PM    [facility] [address] [place], [place], [place]  [address] Phone: [phone]; Fax: [phone]

## 2021-11-20 ENCOUNTER — Encounter: Payer: Self-pay | Admitting: Cardiovascular Disease

## 2021-11-20 ENCOUNTER — Ambulatory Visit: Payer: BC Managed Care – PPO | Attending: Cardiovascular Disease | Admitting: Cardiovascular Disease

## 2021-11-20 VITALS — BP 130/86 | HR 57 | Ht 58.47 in | Wt 143.0 lb

## 2021-11-20 DIAGNOSIS — E7800 Pure hypercholesterolemia, unspecified: Secondary | ICD-10-CM | POA: Diagnosis not present

## 2021-11-20 DIAGNOSIS — I2510 Atherosclerotic heart disease of native coronary artery without angina pectoris: Secondary | ICD-10-CM

## 2021-11-20 MED ORDER — ASPIRIN 81 MG PO TBEC: 81.0000 mg | DELAYED_RELEASE_TABLET | Freq: Every day | ORAL | 3 refills | Status: AC

## 2021-11-20 NOTE — Patient Instructions (Signed)
Medication Instructions:  No changes *If you need a refill on your cardiac medications before your next appointment, please call your pharmacy*   Lab Work: none If you have labs (blood work) drawn today and your tests are completely normal, you will receive your results only by: MyChart Message (if you have MyChart) OR A paper copy in the mail If you have any lab test that is abnormal or we need to change your treatment, we will call you to review the results.   Testing/Procedures: none   Follow-Up: At [facility], you and your health needs are our priority.  As part of our continuing mission to provide you with exceptional heart care, we have created designated Provider Care Teams.  These Care Teams include your primary Cardiologist (physician) and Advanced Practice Providers (APPs -  Physician Assistants and Nurse Practitioners) who all work together to provide you with the care you need, when you need it.  We recommend signing up for the patient portal called "MyChart".  Sign up information is provided on this After Visit Summary.  MyChart is used to connect with patients for Virtual Visits (Telemedicine).  Patients are able to view lab/test results, encounter notes, upcoming appointments, etc.  Non-urgent messages can be sent to your provider as well.   To learn more about what you can do with MyChart, go to [email or web address].    Your next appointment:   12 month(s)  The format for your next appointment:   In Person  Provider:   Christopher McAlhany, MD      Important Information About Sugar       

## 2021-12-19 DIAGNOSIS — L4059 Other psoriatic arthropathy: Secondary | ICD-10-CM | POA: Diagnosis not present

## 2022-01-11 ENCOUNTER — Other Ambulatory Visit: Payer: Self-pay | Admitting: Family Medicine

## 2022-01-12 ENCOUNTER — Other Ambulatory Visit: Payer: Self-pay | Admitting: Family Medicine

## 2022-01-22 ENCOUNTER — Other Ambulatory Visit: Payer: Self-pay

## 2022-01-22 MED ORDER — ROSUVASTATIN CALCIUM 40 MG PO TABS: 40.0000 mg | ORAL_TABLET | Freq: Every day | ORAL | 3 refills | Status: DC

## 2022-03-12 DIAGNOSIS — L409 Psoriasis, unspecified: Secondary | ICD-10-CM | POA: Diagnosis not present

## 2022-03-12 DIAGNOSIS — M7989 Other specified soft tissue disorders: Secondary | ICD-10-CM | POA: Diagnosis not present

## 2022-03-12 DIAGNOSIS — L4059 Other psoriatic arthropathy: Secondary | ICD-10-CM | POA: Diagnosis not present

## 2022-03-12 DIAGNOSIS — M1991 Primary osteoarthritis, unspecified site: Secondary | ICD-10-CM | POA: Diagnosis not present

## 2022-03-26 DIAGNOSIS — Z1231 Encounter for screening mammogram for malignant neoplasm of breast: Secondary | ICD-10-CM | POA: Diagnosis not present

## 2022-03-26 DIAGNOSIS — Z6830 Body mass index (BMI) 30.0-30.9, adult: Secondary | ICD-10-CM | POA: Diagnosis not present

## 2022-03-26 DIAGNOSIS — Z01419 Encounter for gynecological examination (general) (routine) without abnormal findings: Secondary | ICD-10-CM | POA: Diagnosis not present

## 2022-05-21 DIAGNOSIS — M79642 Pain in left hand: Secondary | ICD-10-CM | POA: Diagnosis not present

## 2022-05-21 DIAGNOSIS — M5412 Radiculopathy, cervical region: Secondary | ICD-10-CM | POA: Diagnosis not present

## 2022-05-21 DIAGNOSIS — M79641 Pain in right hand: Secondary | ICD-10-CM | POA: Diagnosis not present

## 2022-05-21 DIAGNOSIS — M25511 Pain in right shoulder: Secondary | ICD-10-CM | POA: Diagnosis not present

## 2022-05-29 DIAGNOSIS — B309 Viral conjunctivitis, unspecified: Secondary | ICD-10-CM | POA: Diagnosis not present

## 2022-06-21 DIAGNOSIS — M6281 Muscle weakness (generalized): Secondary | ICD-10-CM | POA: Diagnosis not present

## 2022-06-21 DIAGNOSIS — M542 Cervicalgia: Secondary | ICD-10-CM | POA: Diagnosis not present

## 2022-06-28 DIAGNOSIS — M9902 Segmental and somatic dysfunction of thoracic region: Secondary | ICD-10-CM | POA: Diagnosis not present

## 2022-06-28 DIAGNOSIS — M62830 Muscle spasm of back: Secondary | ICD-10-CM | POA: Diagnosis not present

## 2022-06-28 DIAGNOSIS — M9906 Segmental and somatic dysfunction of lower extremity: Secondary | ICD-10-CM | POA: Diagnosis not present

## 2022-06-28 DIAGNOSIS — M5413 Radiculopathy, cervicothoracic region: Secondary | ICD-10-CM | POA: Diagnosis not present

## 2022-06-28 DIAGNOSIS — M9901 Segmental and somatic dysfunction of cervical region: Secondary | ICD-10-CM | POA: Diagnosis not present

## 2022-06-28 DIAGNOSIS — M9903 Segmental and somatic dysfunction of lumbar region: Secondary | ICD-10-CM | POA: Diagnosis not present

## 2022-06-28 DIAGNOSIS — M5417 Radiculopathy, lumbosacral region: Secondary | ICD-10-CM | POA: Diagnosis not present

## 2022-06-28 DIAGNOSIS — M25551 Pain in right hip: Secondary | ICD-10-CM | POA: Diagnosis not present

## 2022-06-28 DIAGNOSIS — M9905 Segmental and somatic dysfunction of pelvic region: Secondary | ICD-10-CM | POA: Diagnosis not present

## 2022-07-04 DIAGNOSIS — M542 Cervicalgia: Secondary | ICD-10-CM | POA: Diagnosis not present

## 2022-07-04 DIAGNOSIS — M6281 Muscle weakness (generalized): Secondary | ICD-10-CM | POA: Diagnosis not present

## 2022-07-16 DIAGNOSIS — M9901 Segmental and somatic dysfunction of cervical region: Secondary | ICD-10-CM | POA: Diagnosis not present

## 2022-07-16 DIAGNOSIS — M5413 Radiculopathy, cervicothoracic region: Secondary | ICD-10-CM | POA: Diagnosis not present

## 2022-07-16 DIAGNOSIS — M9906 Segmental and somatic dysfunction of lower extremity: Secondary | ICD-10-CM | POA: Diagnosis not present

## 2022-07-16 DIAGNOSIS — M62830 Muscle spasm of back: Secondary | ICD-10-CM | POA: Diagnosis not present

## 2022-07-16 DIAGNOSIS — M9903 Segmental and somatic dysfunction of lumbar region: Secondary | ICD-10-CM | POA: Diagnosis not present

## 2022-07-16 DIAGNOSIS — M5417 Radiculopathy, lumbosacral region: Secondary | ICD-10-CM | POA: Diagnosis not present

## 2022-07-16 DIAGNOSIS — M9905 Segmental and somatic dysfunction of pelvic region: Secondary | ICD-10-CM | POA: Diagnosis not present

## 2022-07-16 DIAGNOSIS — M9902 Segmental and somatic dysfunction of thoracic region: Secondary | ICD-10-CM | POA: Diagnosis not present

## 2022-07-19 ENCOUNTER — Other Ambulatory Visit: Payer: Self-pay | Admitting: Acute Care

## 2022-07-19 DIAGNOSIS — Z122 Encounter for screening for malignant neoplasm of respiratory organs: Secondary | ICD-10-CM

## 2022-07-19 DIAGNOSIS — Z87891 Personal history of nicotine dependence: Secondary | ICD-10-CM

## 2022-07-31 DIAGNOSIS — M62830 Muscle spasm of back: Secondary | ICD-10-CM | POA: Diagnosis not present

## 2022-07-31 DIAGNOSIS — M5413 Radiculopathy, cervicothoracic region: Secondary | ICD-10-CM | POA: Diagnosis not present

## 2022-07-31 DIAGNOSIS — M9902 Segmental and somatic dysfunction of thoracic region: Secondary | ICD-10-CM | POA: Diagnosis not present

## 2022-07-31 DIAGNOSIS — M25551 Pain in right hip: Secondary | ICD-10-CM | POA: Diagnosis not present

## 2022-07-31 DIAGNOSIS — M9903 Segmental and somatic dysfunction of lumbar region: Secondary | ICD-10-CM | POA: Diagnosis not present

## 2022-07-31 DIAGNOSIS — M9905 Segmental and somatic dysfunction of pelvic region: Secondary | ICD-10-CM | POA: Diagnosis not present

## 2022-07-31 DIAGNOSIS — M5417 Radiculopathy, lumbosacral region: Secondary | ICD-10-CM | POA: Diagnosis not present

## 2022-07-31 DIAGNOSIS — M9901 Segmental and somatic dysfunction of cervical region: Secondary | ICD-10-CM | POA: Diagnosis not present

## 2022-07-31 DIAGNOSIS — M9906 Segmental and somatic dysfunction of lower extremity: Secondary | ICD-10-CM | POA: Diagnosis not present

## 2022-08-03 DIAGNOSIS — M542 Cervicalgia: Secondary | ICD-10-CM | POA: Diagnosis not present

## 2022-08-03 DIAGNOSIS — M6281 Muscle weakness (generalized): Secondary | ICD-10-CM | POA: Diagnosis not present

## 2022-08-07 DIAGNOSIS — M62830 Muscle spasm of back: Secondary | ICD-10-CM | POA: Diagnosis not present

## 2022-08-07 DIAGNOSIS — M9903 Segmental and somatic dysfunction of lumbar region: Secondary | ICD-10-CM | POA: Diagnosis not present

## 2022-08-07 DIAGNOSIS — M25551 Pain in right hip: Secondary | ICD-10-CM | POA: Diagnosis not present

## 2022-08-07 DIAGNOSIS — M5417 Radiculopathy, lumbosacral region: Secondary | ICD-10-CM | POA: Diagnosis not present

## 2022-08-20 ENCOUNTER — Ambulatory Visit
Admission: RE | Admit: 2022-08-20 | Discharge: 2022-08-20 | Disposition: A | Discharge status: Home / Self Care | Payer: BC Managed Care – PPO | Source: Ambulatory Visit | Attending: Family Medicine | Admitting: Family Medicine

## 2022-08-20 DIAGNOSIS — Z87891 Personal history of nicotine dependence: Secondary | ICD-10-CM

## 2022-08-20 DIAGNOSIS — Z122 Encounter for screening for malignant neoplasm of respiratory organs: Secondary | ICD-10-CM

## 2022-08-21 ENCOUNTER — Other Ambulatory Visit: Payer: Self-pay | Admitting: Family Medicine

## 2022-08-21 DIAGNOSIS — M9905 Segmental and somatic dysfunction of pelvic region: Secondary | ICD-10-CM | POA: Diagnosis not present

## 2022-08-21 DIAGNOSIS — M5413 Radiculopathy, cervicothoracic region: Secondary | ICD-10-CM | POA: Diagnosis not present

## 2022-08-21 DIAGNOSIS — M9901 Segmental and somatic dysfunction of cervical region: Secondary | ICD-10-CM | POA: Diagnosis not present

## 2022-08-21 DIAGNOSIS — M5417 Radiculopathy, lumbosacral region: Secondary | ICD-10-CM | POA: Diagnosis not present

## 2022-08-21 DIAGNOSIS — M9903 Segmental and somatic dysfunction of lumbar region: Secondary | ICD-10-CM | POA: Diagnosis not present

## 2022-08-21 DIAGNOSIS — M9906 Segmental and somatic dysfunction of lower extremity: Secondary | ICD-10-CM | POA: Diagnosis not present

## 2022-08-21 DIAGNOSIS — M62830 Muscle spasm of back: Secondary | ICD-10-CM | POA: Diagnosis not present

## 2022-08-21 DIAGNOSIS — M9902 Segmental and somatic dysfunction of thoracic region: Secondary | ICD-10-CM | POA: Diagnosis not present

## 2022-09-04 DIAGNOSIS — M5417 Radiculopathy, lumbosacral region: Secondary | ICD-10-CM | POA: Diagnosis not present

## 2022-09-04 DIAGNOSIS — M9902 Segmental and somatic dysfunction of thoracic region: Secondary | ICD-10-CM | POA: Diagnosis not present

## 2022-09-04 DIAGNOSIS — M5413 Radiculopathy, cervicothoracic region: Secondary | ICD-10-CM | POA: Diagnosis not present

## 2022-09-04 DIAGNOSIS — M9905 Segmental and somatic dysfunction of pelvic region: Secondary | ICD-10-CM | POA: Diagnosis not present

## 2022-09-04 DIAGNOSIS — M62830 Muscle spasm of back: Secondary | ICD-10-CM | POA: Diagnosis not present

## 2022-09-04 DIAGNOSIS — M9903 Segmental and somatic dysfunction of lumbar region: Secondary | ICD-10-CM | POA: Diagnosis not present

## 2022-09-04 DIAGNOSIS — M25551 Pain in right hip: Secondary | ICD-10-CM | POA: Diagnosis not present

## 2022-09-04 DIAGNOSIS — M9906 Segmental and somatic dysfunction of lower extremity: Secondary | ICD-10-CM | POA: Diagnosis not present

## 2022-09-04 DIAGNOSIS — M9901 Segmental and somatic dysfunction of cervical region: Secondary | ICD-10-CM | POA: Diagnosis not present

## 2022-09-12 ENCOUNTER — Other Ambulatory Visit: Payer: Self-pay | Admitting: Family Medicine

## 2022-09-14 ENCOUNTER — Telehealth: Payer: Self-pay | Admitting: Family Medicine

## 2022-09-14 NOTE — Telephone Encounter (Signed)
Pt has already been scheduled for a CPE on Tuesday, 10/23/22.  Pt is completely out of her losartan (COZAAR) 50 MG tablet.  Pt is asking if MD would be willing to send a 30 day supply, until   she can come in on 10/23/22?  Please advise.   [id number] - [place], [place] - [address] Phone: [phone]  Fax: [phone]

## 2022-09-18 ENCOUNTER — Other Ambulatory Visit: Payer: Self-pay | Admitting: Family Medicine

## 2022-09-18 DIAGNOSIS — L4059 Other psoriatic arthropathy: Secondary | ICD-10-CM | POA: Diagnosis not present

## 2022-09-18 DIAGNOSIS — M1991 Primary osteoarthritis, unspecified site: Secondary | ICD-10-CM | POA: Diagnosis not present

## 2022-09-18 DIAGNOSIS — L409 Psoriasis, unspecified: Secondary | ICD-10-CM | POA: Diagnosis not present

## 2022-09-18 MED ORDER — LOSARTAN POTASSIUM 50 MG PO TABS: ORAL_TABLET | ORAL | 0 refills | Status: DC

## 2022-09-18 NOTE — Telephone Encounter (Signed)
Rx sent 

## 2022-09-20 ENCOUNTER — Encounter (INDEPENDENT_AMBULATORY_CARE_PROVIDER_SITE_OTHER): Payer: Self-pay

## 2022-09-26 DIAGNOSIS — M9905 Segmental and somatic dysfunction of pelvic region: Secondary | ICD-10-CM | POA: Diagnosis not present

## 2022-09-26 DIAGNOSIS — M5413 Radiculopathy, cervicothoracic region: Secondary | ICD-10-CM | POA: Diagnosis not present

## 2022-09-26 DIAGNOSIS — M62830 Muscle spasm of back: Secondary | ICD-10-CM | POA: Diagnosis not present

## 2022-09-26 DIAGNOSIS — M9902 Segmental and somatic dysfunction of thoracic region: Secondary | ICD-10-CM | POA: Diagnosis not present

## 2022-09-26 DIAGNOSIS — M9903 Segmental and somatic dysfunction of lumbar region: Secondary | ICD-10-CM | POA: Diagnosis not present

## 2022-09-26 DIAGNOSIS — M5417 Radiculopathy, lumbosacral region: Secondary | ICD-10-CM | POA: Diagnosis not present

## 2022-09-26 DIAGNOSIS — M9906 Segmental and somatic dysfunction of lower extremity: Secondary | ICD-10-CM | POA: Diagnosis not present

## 2022-09-26 DIAGNOSIS — M9901 Segmental and somatic dysfunction of cervical region: Secondary | ICD-10-CM | POA: Diagnosis not present

## 2022-09-28 ENCOUNTER — Other Ambulatory Visit: Payer: Self-pay | Admitting: Family Medicine

## 2022-10-17 DIAGNOSIS — M9901 Segmental and somatic dysfunction of cervical region: Secondary | ICD-10-CM | POA: Diagnosis not present

## 2022-10-17 DIAGNOSIS — M9906 Segmental and somatic dysfunction of lower extremity: Secondary | ICD-10-CM | POA: Diagnosis not present

## 2022-10-17 DIAGNOSIS — M5417 Radiculopathy, lumbosacral region: Secondary | ICD-10-CM | POA: Diagnosis not present

## 2022-10-17 DIAGNOSIS — M9902 Segmental and somatic dysfunction of thoracic region: Secondary | ICD-10-CM | POA: Diagnosis not present

## 2022-10-17 DIAGNOSIS — M9903 Segmental and somatic dysfunction of lumbar region: Secondary | ICD-10-CM | POA: Diagnosis not present

## 2022-10-17 DIAGNOSIS — M9905 Segmental and somatic dysfunction of pelvic region: Secondary | ICD-10-CM | POA: Diagnosis not present

## 2022-10-17 DIAGNOSIS — M62830 Muscle spasm of back: Secondary | ICD-10-CM | POA: Diagnosis not present

## 2022-10-17 DIAGNOSIS — M5413 Radiculopathy, cervicothoracic region: Secondary | ICD-10-CM | POA: Diagnosis not present

## 2022-10-22 ENCOUNTER — Telehealth: Payer: Self-pay | Admitting: Family Medicine

## 2022-10-22 DIAGNOSIS — Z0000 Encounter for general adult medical examination without abnormal findings: Secondary | ICD-10-CM

## 2022-10-22 NOTE — Telephone Encounter (Signed)
Pt called to reschedule her CPE for Wednesday, 10/24/22. MD only has afternoon availability. Pt would like to know if she can come in first thing that morning, to do her labs? Pt will then return at 1:30 pm for her CPE. Please advise.

## 2022-10-22 NOTE — Telephone Encounter (Signed)
Labs placed and patient aware

## 2022-10-23 ENCOUNTER — Encounter: Payer: BC Managed Care – PPO | Admitting: Family Medicine

## 2022-10-24 ENCOUNTER — Encounter: Payer: BC Managed Care – PPO | Admitting: Family Medicine

## 2022-10-24 ENCOUNTER — Other Ambulatory Visit: Payer: Self-pay | Admitting: Family Medicine

## 2022-10-29 ENCOUNTER — Other Ambulatory Visit: Payer: Self-pay | Admitting: Family Medicine

## 2022-11-02 ENCOUNTER — Ambulatory Visit (INDEPENDENT_AMBULATORY_CARE_PROVIDER_SITE_OTHER): Payer: BC Managed Care – PPO | Admitting: Family Medicine

## 2022-11-02 ENCOUNTER — Encounter: Payer: Self-pay | Admitting: Family Medicine

## 2022-11-02 ENCOUNTER — Other Ambulatory Visit: Payer: BC Managed Care – PPO

## 2022-11-02 VITALS — BP 138/80 | HR 90 | Temp 98.1°F | Ht 58.27 in | Wt 142.2 lb

## 2022-11-02 DIAGNOSIS — Z23 Encounter for immunization: Secondary | ICD-10-CM

## 2022-11-02 DIAGNOSIS — Z0000 Encounter for general adult medical examination without abnormal findings: Secondary | ICD-10-CM | POA: Diagnosis not present

## 2022-11-02 DIAGNOSIS — I2510 Atherosclerotic heart disease of native coronary artery without angina pectoris: Secondary | ICD-10-CM | POA: Diagnosis not present

## 2022-11-02 LAB — COMPREHENSIVE METABOLIC PANEL
ALT: 26 U/L (ref 0–35)
AST: 27 U/L (ref 0–37)
Albumin: 4.5 g/dL (ref 3.5–5.2)
Alkaline Phosphatase: 51 U/L (ref 39–117)
BUN: 16 mg/dL (ref 6–23)
CO2: 28 meq/L (ref 19–32)
Calcium: 9.6 mg/dL (ref 8.4–10.5)
Chloride: 103 meq/L (ref 96–112)
Creatinine, Ser: 0.91 mg/dL (ref 0.40–1.20)
GFR: 67.43 mL/min (ref >60.00)
Glucose, Bld: 88 mg/dL (ref 70–99)
Potassium: 4.6 meq/L (ref 3.5–5.1)
Sodium: 139 meq/L (ref 135–145)
Total Bilirubin: 0.4 mg/dL (ref 0.2–1.2)
Total Protein: 7 g/dL (ref 6.0–8.3)

## 2022-11-02 LAB — LIPID PANEL
Cholesterol: 168 mg/dL (ref 0–200)
HDL: 72.5 mg/dL (ref >39.00)
LDL Cholesterol: 79 mg/dL (ref 0–99)
NonHDL: 95.21
Total CHOL/HDL Ratio: 2
Triglycerides: 83 mg/dL (ref 0.0–149.0)
VLDL: 16.6 mg/dL (ref 0.0–40.0)

## 2022-11-02 LAB — CBC WITH DIFFERENTIAL/PLATELET
Basophils Absolute: 0 10*3/uL (ref 0.0–0.1)
Basophils Relative: 0.8 % (ref 0.0–3.0)
Eosinophils Absolute: 0.1 10*3/uL (ref 0.0–0.7)
Eosinophils Relative: 3.3 % (ref 0.0–5.0)
HCT: 39.4 % (ref 36.0–46.0)
Hemoglobin: 12.9 g/dL (ref 12.0–15.0)
Lymphocytes Relative: 31.5 % (ref 12.0–46.0)
Lymphs Abs: 1.4 10*3/uL (ref 0.7–4.0)
MCHC: 32.7 g/dL (ref 30.0–36.0)
MCV: 98.6 fL (ref 78.0–100.0)
Monocytes Absolute: 0.4 10*3/uL (ref 0.1–1.0)
Monocytes Relative: 9.8 % (ref 3.0–12.0)
Neutro Abs: 2.4 10*3/uL (ref 1.4–7.7)
Neutrophils Relative %: 54.6 % (ref 43.0–77.0)
Platelets: 209 10*3/uL (ref 150.0–400.0)
RBC: 3.99 Mil/uL (ref 3.87–5.11)
RDW: 12.9 % (ref 11.5–15.5)
WBC: 4.4 10*3/uL (ref 4.0–10.5)

## 2022-11-02 LAB — HEMOGLOBIN A1C: Hgb A1c MFr Bld: 5.7 % (ref 4.6–6.5)

## 2022-11-02 MED ORDER — EZETIMIBE 10 MG PO TABS: 10.0000 mg | ORAL_TABLET | Freq: Every day | ORAL | 3 refills | Status: DC

## 2022-11-02 NOTE — Progress Notes (Signed)
Established Patient Office Visit  Subjective   Patient ID: Wendy Davies, female    DOB: May 12, 1960  Age: 62 y.o. MRN: [id number]  Chief Complaint  Patient presents with   Annual Exam    HPI   Wendy Davies is seen for physical exam.  She has history of hypertension, allergic rhinitis, osteoarthritis, psoriatic arthritis, hyperlipidemia.  She does have a history of CAD.  We sent her couple years ago for a coronary calcium scan which came back over 800.  CT morphology study showed 25 to 49% LAD and right coronary 50 to 69% proximal to the mid RCA.  She still exercises fairly regularly.  Denies any recent chest pains.  Has had some recent anxiety symptoms.  Currently on BuSpar 7.5 mg twice daily.  She queries about increasing dosage.  She had labs earlier today and LDL cholesterol was 79.  Total cholesterol 168.  Currently on rosuvastatin 40 mg daily.  She takes losartan 50 mg daily for hypertension.  Not monitoring blood pressures regular at home.  She does take aspirin 81 mg daily.  Health maintenance reviewed:  Health Maintenance  Topic Date Due   COVID-19 Vaccine (2 - Janssen risk series) 06/04/2019   MAMMOGRAM  09/17/2019   Zoster Vaccines- Shingrix (1 of 2) 02/01/2023 (Originally 12/07/1978)   Colonoscopy  09/21/2025   DTaP/Tdap/Td (2 - Td or Tdap) 07/20/2027   Hepatitis C Screening  Completed   HIV Screening  Completed   HPV VACCINES  Aged Out   Lung Cancer Screening  Discontinued   INFLUENZA VACCINE  Discontinued   -Offered flu vaccine today but she declines -She sees GYN and plans to get mammogram soon early next year. -Colonoscopy up-to-date  Social history-she is married and is a second marriage.  She has 2 children from first marriage and 2 stepchildren.  Quit smoking 12 years ago.  30-pack-year history.  No regular alcohol use.   Family history-mother had diabetes and died of MI at age 64.  Father had some type of congenital kidney disease and was on dialysis in his 30s and had  MI and died of that at age 50.  She has 3 brothers and 1 sister.  1 brother who has morbid obesity and has type 2 diabetes and hypertension.  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 Her smoking use included cigarettes. She started smoking about 45 years ago. She has a 30.2 pack-year smoking history. She has never used smokeless tobacco. She reports current alcohol use. She reports that she does not use drugs. family history includes Arthritis/Rheumatoid in her mother; Diabetes in her brother and mother; Heart disease (age of onset: 10) in her father; Heart disease (age of onset: 40) in her mother; Hypertension in her brother; Kidney disease in her father. Allergies  Allergen Reactions   Pork Allergy Anaphylaxis  Enbrel [Etanercept]     Drops WBC   Lisinopril    Morphine Other (See Comments)   Morphine And Codeine    Pollen Extract    Pork-Derived Products      Review of Systems  Constitutional:  Negative for chills, fever, malaise/fatigue and weight loss.  HENT:  Negative for hearing loss.   Eyes:  Negative for blurred vision and double vision.  Respiratory:  Negative for cough and shortness of breath.   Cardiovascular:  Negative for chest pain, palpitations and leg swelling.  Gastrointestinal:  Negative for abdominal pain, blood in stool, constipation  and diarrhea.  Genitourinary:  Negative for dysuria.  Skin:  Negative for rash.  Neurological:  Negative for dizziness, speech change, seizures, loss of consciousness and headaches.  Psychiatric/Behavioral:  Negative for depression. The patient is nervous/anxious.       Objective:     BP 138/80 (BP Location: Left Arm, Patient Position: Sitting, Cuff Size: Normal)   Pulse 90   Temp 98.1 F (36.7 C) (Oral)   Ht 4' 10.27" (1.48 m)   Wt 142 lb 3.2 oz (64.5 kg)   LMP  (LMP Unknown)   SpO2 99%   BMI 29.45 kg/m  BP Readings from Last 3 Encounters:  11/02/22 138/80  11/20/21 130/86  07/11/21 134/86   Wt Readings from Last 3 Encounters:  11/02/22 142 lb 3.2 oz (64.5 kg)  11/20/21 143 lb (64.9 kg)  07/11/21 136 lb 9.6 oz (62 kg)      Physical Exam Vitals reviewed.  Constitutional:      General: She is not in acute distress.    Appearance: She is well-developed.  HENT:     Head: Normocephalic and atraumatic.  Eyes:     Pupils: Pupils are equal, round, and reactive to light.  Neck:     Thyroid: No thyromegaly.  Cardiovascular:     Rate and Rhythm: Normal rate and regular rhythm.     Heart sounds: Normal heart sounds. No murmur heard. Pulmonary:     Effort: No respiratory distress.     Breath sounds: Normal breath sounds. No wheezing or rales.  Abdominal:     General: Bowel sounds are normal. There is no distension.     Palpations: Abdomen is soft. There is no mass.     Tenderness: There is no abdominal tenderness. There is no guarding or rebound.  Musculoskeletal:        General: Normal range of motion.     Cervical back: Normal range of motion and neck supple.  Lymphadenopathy:     Cervical: No cervical adenopathy.  Skin:    Findings: No rash.  Neurological:     Mental Status: She is alert and oriented to person, place, and time.     Cranial Nerves: No cranial nerve deficit.  Psychiatric:        Behavior: Behavior normal.        Thought Content: Thought content  normal.        Judgment: Judgment normal.      Results for orders placed or performed in visit on 11/02/22  CBC with Differential/Platelets  Result Value Ref Range   WBC 4.4 4.0 - 10.5 K/uL   RBC 3.99 3.87 - 5.11 Mil/uL   Hemoglobin 12.9 12.0 - 15.0 g/dL   HCT 16.1 09.6 - 04.5 %   MCV 98.6 78.0 - 100.0 fl   MCHC 32.7 30.0 - 36.0 g/dL   RDW 40.9 81.1 - 91.4 %   Platelets  209.0 150.0 - 400.0 K/uL   Neutrophils Relative % 54.6 43.0 - 77.0 %   Lymphocytes Relative 31.5 12.0 - 46.0 %   Monocytes Relative 9.8 3.0 - 12.0 %   Eosinophils Relative 3.3 0.0 - 5.0 %   Basophils Relative 0.8 0.0 - 3.0 %   Neutro Abs 2.4 1.4 - 7.7 K/uL   Lymphs Abs 1.4 0.7 - 4.0 K/uL   Monocytes Absolute 0.4 0.1 - 1.0 K/uL   Eosinophils Absolute 0.1 0.0 - 0.7 K/uL   Basophils Absolute 0.0 0.0 - 0.1 K/uL  Hemoglobin A1c  Result Value Ref Range   Hgb A1c MFr Bld 5.7 4.6 - 6.5 %  Lipid panel  Result Value Ref Range   Cholesterol 168 0 - 200 mg/dL   Triglycerides 42.7 0.0 - 149.0 mg/dL   HDL 06.23 >76.28 mg/dL   VLDL 31.5 0.0 - 17.6 mg/dL   LDL Cholesterol 79 0 - 99 mg/dL   Total CHOL/HDL Ratio 2    NonHDL 95.21   CMP  Result Value Ref Range   Sodium 139 135 - 145 mEq/L   Potassium 4.6 3.5 - 5.1 mEq/L   Chloride 103 96 - 112 mEq/L   CO2 28 19 - 32 mEq/L   Glucose, Bld 88 70 - 99 mg/dL   BUN 16 6 - 23 mg/dL   Creatinine, Ser 1.60 0.40 - 1.20 mg/dL   Total Bilirubin 0.4 0.2 - 1.2 mg/dL   Alkaline Phosphatase 51 39 - 117 U/L   AST 27 0 - 37 U/L   ALT 26 0 - 35 U/L   Total Protein 7.0 6.0 - 8.3 g/dL   Albumin 4.5 3.5 - 5.2 g/dL   GFR 73.71 >06.26 mL/min   Calcium 9.6 8.4 - 10.5 mg/dL      The 94-WNIO ASCVD risk score (Arnett DK, et al., 2019) is: 4.8%    Assessment & Plan:   Physical exam.  Glanda has history of CAD, hypertension, chronic anxiety symptoms.  No recent chest pains.  Hyperlipidemia with LDL 79.  We discussed several items as follows  -Flu vaccine offered but declined -Would  like to see LDL cholesterol at least below 70.  We discussed addition of Zetia to her rosuvastatin 10 mg daily and recheck lipids in about 2 to 3 months -Continue aspirin 81 mg daily -Discussed titrating her BuSpar to 15 mg twice daily and give feedback in a week or so -She is encouraged to set up a repeat mammogram for this winter -Continue regular exercise habits -Also discussed Shingrix vaccine but she declines   No follow-ups on file.    Wendy Peat, MD

## 2022-11-02 NOTE — Patient Instructions (Signed)
Set up repeat mammogram  Get home BP monitor and be in touch if consistently > 130/80  Start the Zetia and we will check lipids at follow up in February.  Increase the Buspar to 15 mg twice daily -- and give me some feedback.

## 2022-11-13 DIAGNOSIS — H25813 Combined forms of age-related cataract, bilateral: Secondary | ICD-10-CM | POA: Diagnosis not present

## 2022-11-13 DIAGNOSIS — H524 Presbyopia: Secondary | ICD-10-CM | POA: Diagnosis not present

## 2022-11-19 ENCOUNTER — Other Ambulatory Visit: Payer: Self-pay | Admitting: Family Medicine

## 2022-11-21 DIAGNOSIS — M25551 Pain in right hip: Secondary | ICD-10-CM | POA: Diagnosis not present

## 2022-11-21 DIAGNOSIS — M9903 Segmental and somatic dysfunction of lumbar region: Secondary | ICD-10-CM | POA: Diagnosis not present

## 2022-11-21 DIAGNOSIS — M5417 Radiculopathy, lumbosacral region: Secondary | ICD-10-CM | POA: Diagnosis not present

## 2022-11-21 DIAGNOSIS — M5413 Radiculopathy, cervicothoracic region: Secondary | ICD-10-CM | POA: Diagnosis not present

## 2022-11-21 DIAGNOSIS — M9906 Segmental and somatic dysfunction of lower extremity: Secondary | ICD-10-CM | POA: Diagnosis not present

## 2022-11-21 DIAGNOSIS — M9905 Segmental and somatic dysfunction of pelvic region: Secondary | ICD-10-CM | POA: Diagnosis not present

## 2022-11-21 DIAGNOSIS — M9902 Segmental and somatic dysfunction of thoracic region: Secondary | ICD-10-CM | POA: Diagnosis not present

## 2022-11-21 DIAGNOSIS — M62830 Muscle spasm of back: Secondary | ICD-10-CM | POA: Diagnosis not present

## 2022-11-21 DIAGNOSIS — M9901 Segmental and somatic dysfunction of cervical region: Secondary | ICD-10-CM | POA: Diagnosis not present

## 2022-11-25 ENCOUNTER — Encounter: Payer: Self-pay | Admitting: Family Medicine

## 2022-11-26 MED ORDER — BUSPIRONE HCL 15 MG PO TABS: 15.0000 mg | ORAL_TABLET | Freq: Two times a day (BID) | ORAL | 1 refills | Status: DC

## 2022-11-29 ENCOUNTER — Other Ambulatory Visit: Payer: Self-pay | Admitting: Family Medicine

## 2022-12-05 DIAGNOSIS — M5413 Radiculopathy, cervicothoracic region: Secondary | ICD-10-CM | POA: Diagnosis not present

## 2022-12-05 DIAGNOSIS — M9903 Segmental and somatic dysfunction of lumbar region: Secondary | ICD-10-CM | POA: Diagnosis not present

## 2022-12-05 DIAGNOSIS — M9901 Segmental and somatic dysfunction of cervical region: Secondary | ICD-10-CM | POA: Diagnosis not present

## 2022-12-05 DIAGNOSIS — M5417 Radiculopathy, lumbosacral region: Secondary | ICD-10-CM | POA: Diagnosis not present

## 2022-12-05 DIAGNOSIS — M9906 Segmental and somatic dysfunction of lower extremity: Secondary | ICD-10-CM | POA: Diagnosis not present

## 2022-12-05 DIAGNOSIS — M9902 Segmental and somatic dysfunction of thoracic region: Secondary | ICD-10-CM | POA: Diagnosis not present

## 2022-12-05 DIAGNOSIS — M9905 Segmental and somatic dysfunction of pelvic region: Secondary | ICD-10-CM | POA: Diagnosis not present

## 2022-12-05 DIAGNOSIS — M62830 Muscle spasm of back: Secondary | ICD-10-CM | POA: Diagnosis not present

## 2023-01-14 DIAGNOSIS — M9902 Segmental and somatic dysfunction of thoracic region: Secondary | ICD-10-CM | POA: Diagnosis not present

## 2023-01-14 DIAGNOSIS — M5413 Radiculopathy, cervicothoracic region: Secondary | ICD-10-CM | POA: Diagnosis not present

## 2023-01-14 DIAGNOSIS — M9903 Segmental and somatic dysfunction of lumbar region: Secondary | ICD-10-CM | POA: Diagnosis not present

## 2023-01-14 DIAGNOSIS — M9906 Segmental and somatic dysfunction of lower extremity: Secondary | ICD-10-CM | POA: Diagnosis not present

## 2023-01-14 DIAGNOSIS — M62830 Muscle spasm of back: Secondary | ICD-10-CM | POA: Diagnosis not present

## 2023-01-14 DIAGNOSIS — M9901 Segmental and somatic dysfunction of cervical region: Secondary | ICD-10-CM | POA: Diagnosis not present

## 2023-01-14 DIAGNOSIS — M9905 Segmental and somatic dysfunction of pelvic region: Secondary | ICD-10-CM | POA: Diagnosis not present

## 2023-01-14 DIAGNOSIS — M5417 Radiculopathy, lumbosacral region: Secondary | ICD-10-CM | POA: Diagnosis not present

## 2023-03-07 ENCOUNTER — Other Ambulatory Visit: Payer: Self-pay | Admitting: Cardiovascular Disease

## 2023-03-25 ENCOUNTER — Ambulatory Visit: Payer: BC Managed Care – PPO | Admitting: Family Medicine

## 2023-04-01 ENCOUNTER — Encounter: Payer: Self-pay | Admitting: Family Medicine

## 2023-04-01 ENCOUNTER — Ambulatory Visit: Payer: BC Managed Care – PPO | Admitting: Family Medicine

## 2023-04-01 VITALS — BP 132/80 | HR 91 | Temp 98.2°F | Wt 145.9 lb

## 2023-04-01 DIAGNOSIS — F419 Anxiety disorder, unspecified: Secondary | ICD-10-CM

## 2023-04-01 DIAGNOSIS — Z6830 Body mass index (BMI) 30.0-30.9, adult: Secondary | ICD-10-CM | POA: Diagnosis not present

## 2023-04-01 DIAGNOSIS — L4059 Other psoriatic arthropathy: Secondary | ICD-10-CM | POA: Diagnosis not present

## 2023-04-01 DIAGNOSIS — Z1231 Encounter for screening mammogram for malignant neoplasm of breast: Secondary | ICD-10-CM | POA: Diagnosis not present

## 2023-04-01 DIAGNOSIS — I2510 Atherosclerotic heart disease of native coronary artery without angina pectoris: Secondary | ICD-10-CM

## 2023-04-01 DIAGNOSIS — Z111 Encounter for screening for respiratory tuberculosis: Secondary | ICD-10-CM | POA: Diagnosis not present

## 2023-04-01 DIAGNOSIS — I10 Essential (primary) hypertension: Secondary | ICD-10-CM | POA: Diagnosis not present

## 2023-04-01 DIAGNOSIS — E782 Mixed hyperlipidemia: Secondary | ICD-10-CM | POA: Diagnosis not present

## 2023-04-01 DIAGNOSIS — Z01419 Encounter for gynecological examination (general) (routine) without abnormal findings: Secondary | ICD-10-CM | POA: Diagnosis not present

## 2023-04-01 DIAGNOSIS — M1991 Primary osteoarthritis, unspecified site: Secondary | ICD-10-CM | POA: Diagnosis not present

## 2023-04-01 DIAGNOSIS — L409 Psoriasis, unspecified: Secondary | ICD-10-CM | POA: Diagnosis not present

## 2023-04-01 NOTE — Progress Notes (Signed)
 Established Patient Office Visit  Subjective   Patient ID: Wendy Davies, female    DOB: 1960-03-28  Age: 63 y.o. MRN: [id number]  Chief Complaint  Patient presents with   Medical Management of Chronic Issues    HPI   Wendy Davies is seen for medical follow-up.  She and her husband currently lives at [facility].  She has not been exercising much over the wintertime.  She is generally doing well.  She has psoriatic arthritis and saw her rheumatologist earlier today.  She states labs were drawn at their office but we do not have them back at this point.  Her current medications include rosuvastatin 40 mg daily, BuSpar 15 mg twice daily, losartan 50 mg daily, Zetia 10 mg daily, and aspirin 81 mg daily.  Last fall she was here for physical and had LDL cholesterol 79.  We added Zetia at that point.  She is tolerating well with no side effects.  We plan to get follow-up fasting lipids now but she is not fasting today.  She also had labs drawn earlier today as above.  Denies any recent chest pains.  She had CT morphology studies back in 2023 with coronary calcium score of 886 which was 99 percentile.  She is followed by cardiology.  She has history of some chronic anxiety and we had bumped up her BuSpar to 15 mg last visit and she feels like this has helped.  She is pleased with medication results thus far.  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 Her smoking use included cigarettes. She started smoking about 46 years ago. She has a 30.2 pack-year smoking history. She has never used smokeless tobacco. She reports current alcohol use. She reports that she does not use drugs. family history includes Arthritis/Rheumatoid in her mother; Diabetes in her brother and mother; Heart disease (age of onset: 12) in her father; Heart disease (age of onset: 47) in her mother; Hypertension in her brother; Kidney disease in her father. Allergies  Allergen Reactions   Pork Allergy Anaphylaxis   Enbrel [Etanercept]     Drops WBC   Lisinopril    Morphine Other (See Comments)   Morphine And Codeine    Pollen Extract    Pork-Derived Products     Review of Systems  Constitutional:  Negative for malaise/fatigue.  Eyes:  Negative for blurred vision.  Respiratory:  Negative for shortness of breath.   Cardiovascular:  Negative for chest pain.  Neurological:  Negative for dizziness, weakness and headaches.      Objective:     BP 132/80 (BP Location: Left Arm, Patient Position: Sitting, Cuff Size: Normal)   Pulse 91   Temp 98.2 F (36.8 C) (Oral)   Wt 145 lb 14.4 oz (66.2 kg)   LMP  (LMP Unknown)   SpO2 97%  BMI 30.21 kg/m  BP Readings from Last 3 Encounters:  04/01/23 132/80  11/02/22 138/80  11/20/21 130/86   Wt Readings from Last 3 Encounters:  04/01/23 145 lb 14.4 oz (66.2 kg)  11/02/22 142 lb 3.2 oz (64.5 kg)  11/20/21 143 lb (64.9 kg)      Physical Exam Vitals reviewed.  Constitutional:      General: She is not in acute distress.    Appearance: She is not ill-appearing.  Cardiovascular:     Rate and Rhythm: Normal rate and regular rhythm.  Pulmonary:     Effort: Pulmonary effort is normal.     Breath sounds: Normal breath sounds. No wheezing or rales.  Musculoskeletal:     Right lower leg: No edema.     Left lower leg: No edema.  Neurological:      Mental Status: She is alert.      No results found for any visits on 04/01/23.  Last CBC Lab Results  Component Value Date   WBC 4.4 11/02/2022   HGB 12.9 11/02/2022   HCT 39.4 11/02/2022   MCV 98.6 11/02/2022   MCH 31.4 05/08/2014   RDW 12.9 11/02/2022   PLT 209.0 11/02/2022   Last metabolic panel Lab Results  Component Value Date   GLUCOSE 88 11/02/2022   NA 139 11/02/2022   K 4.6 11/02/2022   CL 103 11/02/2022   CO2 28 11/02/2022   BUN 16 11/02/2022   CREATININE 0.91 11/02/2022   GFR 67.43 11/02/2022   CALCIUM 9.6 11/02/2022   PROT 7.0 11/02/2022   ALBUMIN 4.5 11/02/2022   BILITOT 0.4 11/02/2022   ALKPHOS 51 11/02/2022   AST 27 11/02/2022   ALT 26 11/02/2022   ANIONGAP 9 05/08/2014   Last lipids Lab Results  Component Value Date   CHOL 168 11/02/2022   HDL 72.50 11/02/2022   LDLCALC 79 11/02/2022   TRIG 83.0 11/02/2022   CHOLHDL 2 11/02/2022   Last hemoglobin A1c Lab Results  Component Value Date   HGBA1C 5.7 11/02/2022      The 10-year ASCVD risk score (Arnett DK, et al., 2019) is: 5%    Assessment & Plan:   #1 history of CAD/hyperlipidemia.  Patient on high-dose statin currently with rosuvastatin 40 mg daily and Zetia 10 mg daily which was added last visit.  She is unfortunately not fasting today and had labs drawn earlier today through rheumatology.  Will wait till her physical in the fall to get fasting lipid panel.  Continue low saturated fat diet.  Continue regular exercise habits.  Continue aspirin 81 mg daily  #2 hypertension stable.  Continue losartan 50 mg daily.  #3 chronic anxiety improved on BuSpar 15 mg twice daily.  Continue current dosage.  Evelena Peat, MD

## 2023-04-05 LAB — LAB REPORT - SCANNED: EGFR: 65

## 2023-04-09 ENCOUNTER — Other Ambulatory Visit: Payer: Self-pay | Admitting: Obstetrics and Gynecology

## 2023-04-09 DIAGNOSIS — R928 Other abnormal and inconclusive findings on diagnostic imaging of breast: Secondary | ICD-10-CM

## 2023-04-29 ENCOUNTER — Ambulatory Visit
Admission: RE | Admit: 2023-04-29 | Discharge: 2023-04-29 | Disposition: A | Discharge status: Home / Self Care | Source: Ambulatory Visit | Attending: Obstetrics and Gynecology | Admitting: Obstetrics and Gynecology

## 2023-04-29 ENCOUNTER — Other Ambulatory Visit: Payer: Self-pay | Admitting: Obstetrics and Gynecology

## 2023-04-29 DIAGNOSIS — N6312 Unspecified lump in the right breast, upper inner quadrant: Secondary | ICD-10-CM | POA: Diagnosis not present

## 2023-04-29 DIAGNOSIS — N6310 Unspecified lump in the right breast, unspecified quadrant: Secondary | ICD-10-CM

## 2023-04-29 DIAGNOSIS — R599 Enlarged lymph nodes, unspecified: Secondary | ICD-10-CM

## 2023-04-29 DIAGNOSIS — R928 Other abnormal and inconclusive findings on diagnostic imaging of breast: Secondary | ICD-10-CM

## 2023-04-29 DIAGNOSIS — C50211 Malignant neoplasm of upper-inner quadrant of right female breast: Secondary | ICD-10-CM | POA: Diagnosis not present

## 2023-04-29 DIAGNOSIS — R590 Localized enlarged lymph nodes: Secondary | ICD-10-CM | POA: Diagnosis not present

## 2023-04-29 HISTORY — PX: BREAST BIOPSY: SHX20

## 2023-05-01 LAB — SURGICAL PATHOLOGY

## 2023-05-07 ENCOUNTER — Encounter: Payer: Self-pay | Admitting: Obstetrics and Gynecology

## 2023-05-09 ENCOUNTER — Other Ambulatory Visit: Payer: Self-pay | Admitting: Obstetrics and Gynecology

## 2023-05-09 DIAGNOSIS — C50911 Malignant neoplasm of unspecified site of right female breast: Secondary | ICD-10-CM

## 2023-05-10 ENCOUNTER — Telehealth: Payer: Self-pay | Admitting: *Deleted

## 2023-05-10 NOTE — Telephone Encounter (Signed)
 LVM to patient in reference to upcoming appointment, left my contact to call back will email her the packet for 5/14 [facility]

## 2023-05-14 ENCOUNTER — Other Ambulatory Visit: Payer: Self-pay | Admitting: Family Medicine

## 2023-05-15 ENCOUNTER — Other Ambulatory Visit: Payer: Self-pay | Admitting: Family Medicine

## 2023-06-04 DIAGNOSIS — H10013 Acute follicular conjunctivitis, bilateral: Secondary | ICD-10-CM | POA: Diagnosis not present

## 2023-06-05 ENCOUNTER — Other Ambulatory Visit: Payer: Self-pay

## 2023-06-05 MED ORDER — ROSUVASTATIN CALCIUM 40 MG PO TABS: 40.0000 mg | ORAL_TABLET | Freq: Every day | ORAL | 0 refills | Status: DC

## 2023-06-12 ENCOUNTER — Encounter (HOSPITAL_COMMUNITY): Payer: Self-pay

## 2023-06-12 ENCOUNTER — Encounter: Payer: Self-pay | Admitting: *Deleted

## 2023-06-12 DIAGNOSIS — C50211 Malignant neoplasm of upper-inner quadrant of right female breast: Secondary | ICD-10-CM | POA: Insufficient documentation

## 2023-06-12 DIAGNOSIS — Z170 Estrogen receptor positive status [ER+]: Secondary | ICD-10-CM | POA: Insufficient documentation

## 2023-06-12 DIAGNOSIS — N63 Unspecified lump in unspecified breast: Secondary | ICD-10-CM | POA: Diagnosis not present

## 2023-06-13 ENCOUNTER — Telehealth: Payer: Self-pay | Admitting: *Deleted

## 2023-06-13 NOTE — Telephone Encounter (Signed)
 Spoke to patient to confirm appointment for 5/14, patient has decide not to get care here she will go to [place] since its closer to home, I have informed the team

## 2023-06-17 DIAGNOSIS — N63 Unspecified lump in unspecified breast: Secondary | ICD-10-CM | POA: Diagnosis not present

## 2023-06-19 ENCOUNTER — Other Ambulatory Visit

## 2023-06-25 ENCOUNTER — Encounter: Payer: Self-pay | Admitting: *Deleted

## 2023-07-04 DIAGNOSIS — H10013 Acute follicular conjunctivitis, bilateral: Secondary | ICD-10-CM | POA: Diagnosis not present

## 2023-07-06 ENCOUNTER — Other Ambulatory Visit: Payer: Self-pay | Admitting: Cardiovascular Disease

## 2023-07-11 DIAGNOSIS — R590 Localized enlarged lymph nodes: Secondary | ICD-10-CM | POA: Diagnosis not present

## 2023-07-11 DIAGNOSIS — C50211 Malignant neoplasm of upper-inner quadrant of right female breast: Secondary | ICD-10-CM | POA: Diagnosis not present

## 2023-07-11 DIAGNOSIS — R928 Other abnormal and inconclusive findings on diagnostic imaging of breast: Secondary | ICD-10-CM | POA: Diagnosis not present

## 2023-07-11 DIAGNOSIS — N6312 Unspecified lump in the right breast, upper inner quadrant: Secondary | ICD-10-CM | POA: Diagnosis not present

## 2023-07-17 DIAGNOSIS — Z9189 Other specified personal risk factors, not elsewhere classified: Secondary | ICD-10-CM | POA: Diagnosis not present

## 2023-07-17 DIAGNOSIS — C50911 Malignant neoplasm of unspecified site of right female breast: Secondary | ICD-10-CM | POA: Diagnosis not present

## 2023-07-17 DIAGNOSIS — Z170 Estrogen receptor positive status [ER+]: Secondary | ICD-10-CM | POA: Diagnosis not present

## 2023-07-17 DIAGNOSIS — C50811 Malignant neoplasm of overlapping sites of right female breast: Secondary | ICD-10-CM | POA: Diagnosis not present

## 2023-07-17 DIAGNOSIS — R931 Abnormal findings on diagnostic imaging of heart and coronary circulation: Secondary | ICD-10-CM | POA: Diagnosis not present

## 2023-07-19 ENCOUNTER — Encounter: Payer: Self-pay | Admitting: Cardiovascular Disease

## 2023-08-01 ENCOUNTER — Other Ambulatory Visit: Payer: Self-pay | Admitting: Cardiovascular Disease

## 2023-08-02 NOTE — Telephone Encounter (Signed)
 Patient has moved. Establishing care elsewhere. Asking for enough medication to get to the 7/11 apt she has set up with new provider. Canceling appt scheduled for Sept

## 2023-08-16 DIAGNOSIS — Z8249 Family history of ischemic heart disease and other diseases of the circulatory system: Secondary | ICD-10-CM | POA: Diagnosis not present

## 2023-08-16 DIAGNOSIS — I2510 Atherosclerotic heart disease of native coronary artery without angina pectoris: Secondary | ICD-10-CM | POA: Diagnosis not present

## 2023-08-16 DIAGNOSIS — Z01818 Encounter for other preprocedural examination: Secondary | ICD-10-CM | POA: Diagnosis not present

## 2023-08-16 DIAGNOSIS — Z87891 Personal history of nicotine dependence: Secondary | ICD-10-CM | POA: Diagnosis not present

## 2023-08-19 DIAGNOSIS — C50811 Malignant neoplasm of overlapping sites of right female breast: Secondary | ICD-10-CM | POA: Diagnosis not present

## 2023-08-19 DIAGNOSIS — R590 Localized enlarged lymph nodes: Secondary | ICD-10-CM | POA: Diagnosis not present

## 2023-08-21 ENCOUNTER — Other Ambulatory Visit: Payer: Self-pay | Admitting: Family Medicine

## 2023-09-16 DIAGNOSIS — I2510 Atherosclerotic heart disease of native coronary artery without angina pectoris: Secondary | ICD-10-CM | POA: Diagnosis not present

## 2023-09-16 DIAGNOSIS — Z170 Estrogen receptor positive status [ER+]: Secondary | ICD-10-CM | POA: Diagnosis not present

## 2023-09-16 DIAGNOSIS — Z6830 Body mass index (BMI) 30.0-30.9, adult: Secondary | ICD-10-CM | POA: Diagnosis not present

## 2023-09-16 DIAGNOSIS — E669 Obesity, unspecified: Secondary | ICD-10-CM | POA: Diagnosis not present

## 2023-09-16 DIAGNOSIS — Z87891 Personal history of nicotine dependence: Secondary | ICD-10-CM | POA: Diagnosis not present

## 2023-09-16 DIAGNOSIS — C50811 Malignant neoplasm of overlapping sites of right female breast: Secondary | ICD-10-CM | POA: Diagnosis not present

## 2023-09-16 DIAGNOSIS — F419 Anxiety disorder, unspecified: Secondary | ICD-10-CM | POA: Diagnosis not present

## 2023-09-16 DIAGNOSIS — E7800 Pure hypercholesterolemia, unspecified: Secondary | ICD-10-CM | POA: Diagnosis not present

## 2023-09-16 DIAGNOSIS — I10 Essential (primary) hypertension: Secondary | ICD-10-CM | POA: Diagnosis not present

## 2023-09-21 ENCOUNTER — Other Ambulatory Visit: Payer: Self-pay | Admitting: Family Medicine

## 2023-09-25 DIAGNOSIS — C50811 Malignant neoplasm of overlapping sites of right female breast: Secondary | ICD-10-CM | POA: Diagnosis not present

## 2023-09-25 DIAGNOSIS — Z4889 Encounter for other specified surgical aftercare: Secondary | ICD-10-CM | POA: Diagnosis not present

## 2023-10-10 DIAGNOSIS — G5603 Carpal tunnel syndrome, bilateral upper limbs: Secondary | ICD-10-CM | POA: Diagnosis not present

## 2023-10-10 DIAGNOSIS — I10 Essential (primary) hypertension: Secondary | ICD-10-CM | POA: Diagnosis not present

## 2023-10-10 DIAGNOSIS — L4050 Arthropathic psoriasis, unspecified: Secondary | ICD-10-CM | POA: Diagnosis not present

## 2023-10-10 DIAGNOSIS — E782 Mixed hyperlipidemia: Secondary | ICD-10-CM | POA: Diagnosis not present

## 2023-10-21 DIAGNOSIS — C50211 Malignant neoplasm of upper-inner quadrant of right female breast: Secondary | ICD-10-CM | POA: Diagnosis not present

## 2023-10-21 DIAGNOSIS — Z170 Estrogen receptor positive status [ER+]: Secondary | ICD-10-CM | POA: Diagnosis not present

## 2023-10-24 DIAGNOSIS — C50811 Malignant neoplasm of overlapping sites of right female breast: Secondary | ICD-10-CM | POA: Diagnosis not present

## 2023-10-24 DIAGNOSIS — Z17411 Hormone receptor positive with human epidermal growth factor receptor 2 negative status: Secondary | ICD-10-CM | POA: Diagnosis not present

## 2023-10-28 ENCOUNTER — Ambulatory Visit: Admitting: Cardiovascular Disease

## 2023-10-28 DIAGNOSIS — L4059 Other psoriatic arthropathy: Secondary | ICD-10-CM | POA: Diagnosis not present

## 2023-10-30 DIAGNOSIS — Z170 Estrogen receptor positive status [ER+]: Secondary | ICD-10-CM | POA: Diagnosis not present

## 2023-10-30 DIAGNOSIS — C50811 Malignant neoplasm of overlapping sites of right female breast: Secondary | ICD-10-CM | POA: Diagnosis not present

## 2023-11-05 DIAGNOSIS — C50811 Malignant neoplasm of overlapping sites of right female breast: Secondary | ICD-10-CM | POA: Diagnosis not present

## 2023-11-05 DIAGNOSIS — Z4889 Encounter for other specified surgical aftercare: Secondary | ICD-10-CM | POA: Diagnosis not present

## 2023-11-07 DIAGNOSIS — C50211 Malignant neoplasm of upper-inner quadrant of right female breast: Secondary | ICD-10-CM | POA: Diagnosis not present

## 2023-11-07 DIAGNOSIS — Z170 Estrogen receptor positive status [ER+]: Secondary | ICD-10-CM | POA: Diagnosis not present

## 2023-11-11 DIAGNOSIS — Z170 Estrogen receptor positive status [ER+]: Secondary | ICD-10-CM | POA: Diagnosis not present

## 2023-11-11 DIAGNOSIS — C50211 Malignant neoplasm of upper-inner quadrant of right female breast: Secondary | ICD-10-CM | POA: Diagnosis not present

## 2023-11-12 DIAGNOSIS — I890 Lymphedema, not elsewhere classified: Secondary | ICD-10-CM | POA: Diagnosis not present

## 2023-11-12 DIAGNOSIS — C50211 Malignant neoplasm of upper-inner quadrant of right female breast: Secondary | ICD-10-CM | POA: Diagnosis not present

## 2023-11-12 DIAGNOSIS — Z170 Estrogen receptor positive status [ER+]: Secondary | ICD-10-CM | POA: Diagnosis not present

## 2023-11-12 DIAGNOSIS — Z736 Limitation of activities due to disability: Secondary | ICD-10-CM | POA: Diagnosis not present

## 2023-11-13 DIAGNOSIS — C50211 Malignant neoplasm of upper-inner quadrant of right female breast: Secondary | ICD-10-CM | POA: Diagnosis not present

## 2023-11-13 DIAGNOSIS — Z170 Estrogen receptor positive status [ER+]: Secondary | ICD-10-CM | POA: Diagnosis not present

## 2023-11-13 DIAGNOSIS — Z510 Encounter for antineoplastic radiation therapy: Secondary | ICD-10-CM | POA: Diagnosis not present

## 2023-11-18 DIAGNOSIS — H2513 Age-related nuclear cataract, bilateral: Secondary | ICD-10-CM | POA: Diagnosis not present

## 2023-11-18 DIAGNOSIS — H524 Presbyopia: Secondary | ICD-10-CM | POA: Diagnosis not present

## 2023-11-19 DIAGNOSIS — Z510 Encounter for antineoplastic radiation therapy: Secondary | ICD-10-CM | POA: Diagnosis not present

## 2023-11-19 DIAGNOSIS — Z170 Estrogen receptor positive status [ER+]: Secondary | ICD-10-CM | POA: Diagnosis not present

## 2023-11-19 DIAGNOSIS — Z736 Limitation of activities due to disability: Secondary | ICD-10-CM | POA: Diagnosis not present

## 2023-11-19 DIAGNOSIS — I890 Lymphedema, not elsewhere classified: Secondary | ICD-10-CM | POA: Diagnosis not present

## 2023-11-19 DIAGNOSIS — C50211 Malignant neoplasm of upper-inner quadrant of right female breast: Secondary | ICD-10-CM | POA: Diagnosis not present

## 2023-11-21 DIAGNOSIS — Z170 Estrogen receptor positive status [ER+]: Secondary | ICD-10-CM | POA: Diagnosis not present

## 2023-11-21 DIAGNOSIS — I890 Lymphedema, not elsewhere classified: Secondary | ICD-10-CM | POA: Diagnosis not present

## 2023-11-21 DIAGNOSIS — Z736 Limitation of activities due to disability: Secondary | ICD-10-CM | POA: Diagnosis not present

## 2023-11-21 DIAGNOSIS — C50211 Malignant neoplasm of upper-inner quadrant of right female breast: Secondary | ICD-10-CM | POA: Diagnosis not present

## 2023-11-25 DIAGNOSIS — I972 Postmastectomy lymphedema syndrome: Secondary | ICD-10-CM | POA: Diagnosis not present

## 2023-11-25 DIAGNOSIS — I890 Lymphedema, not elsewhere classified: Secondary | ICD-10-CM | POA: Diagnosis not present

## 2023-11-26 DIAGNOSIS — Z510 Encounter for antineoplastic radiation therapy: Secondary | ICD-10-CM | POA: Diagnosis not present

## 2023-11-26 DIAGNOSIS — Z170 Estrogen receptor positive status [ER+]: Secondary | ICD-10-CM | POA: Diagnosis not present

## 2023-11-26 DIAGNOSIS — C50211 Malignant neoplasm of upper-inner quadrant of right female breast: Secondary | ICD-10-CM | POA: Diagnosis not present

## 2023-11-27 DIAGNOSIS — I890 Lymphedema, not elsewhere classified: Secondary | ICD-10-CM | POA: Diagnosis not present

## 2023-11-27 DIAGNOSIS — C50211 Malignant neoplasm of upper-inner quadrant of right female breast: Secondary | ICD-10-CM | POA: Diagnosis not present

## 2023-11-27 DIAGNOSIS — Z510 Encounter for antineoplastic radiation therapy: Secondary | ICD-10-CM | POA: Diagnosis not present

## 2023-11-27 DIAGNOSIS — Z170 Estrogen receptor positive status [ER+]: Secondary | ICD-10-CM | POA: Diagnosis not present

## 2023-11-28 DIAGNOSIS — C50211 Malignant neoplasm of upper-inner quadrant of right female breast: Secondary | ICD-10-CM | POA: Diagnosis not present

## 2023-11-28 DIAGNOSIS — Z170 Estrogen receptor positive status [ER+]: Secondary | ICD-10-CM | POA: Diagnosis not present

## 2023-11-28 DIAGNOSIS — Z510 Encounter for antineoplastic radiation therapy: Secondary | ICD-10-CM | POA: Diagnosis not present

## 2023-11-29 DIAGNOSIS — Z510 Encounter for antineoplastic radiation therapy: Secondary | ICD-10-CM | POA: Diagnosis not present

## 2023-11-29 DIAGNOSIS — C50211 Malignant neoplasm of upper-inner quadrant of right female breast: Secondary | ICD-10-CM | POA: Diagnosis not present

## 2023-11-29 DIAGNOSIS — Z170 Estrogen receptor positive status [ER+]: Secondary | ICD-10-CM | POA: Diagnosis not present

## 2023-12-02 DIAGNOSIS — Z170 Estrogen receptor positive status [ER+]: Secondary | ICD-10-CM | POA: Diagnosis not present

## 2023-12-02 DIAGNOSIS — C50211 Malignant neoplasm of upper-inner quadrant of right female breast: Secondary | ICD-10-CM | POA: Diagnosis not present

## 2023-12-02 DIAGNOSIS — Z510 Encounter for antineoplastic radiation therapy: Secondary | ICD-10-CM | POA: Diagnosis not present

## 2023-12-03 DIAGNOSIS — Z170 Estrogen receptor positive status [ER+]: Secondary | ICD-10-CM | POA: Diagnosis not present

## 2023-12-03 DIAGNOSIS — Z510 Encounter for antineoplastic radiation therapy: Secondary | ICD-10-CM | POA: Diagnosis not present

## 2023-12-03 DIAGNOSIS — C50211 Malignant neoplasm of upper-inner quadrant of right female breast: Secondary | ICD-10-CM | POA: Diagnosis not present

## 2023-12-04 DIAGNOSIS — C50211 Malignant neoplasm of upper-inner quadrant of right female breast: Secondary | ICD-10-CM | POA: Diagnosis not present

## 2023-12-04 DIAGNOSIS — I890 Lymphedema, not elsewhere classified: Secondary | ICD-10-CM | POA: Diagnosis not present

## 2023-12-04 DIAGNOSIS — Z170 Estrogen receptor positive status [ER+]: Secondary | ICD-10-CM | POA: Diagnosis not present

## 2023-12-04 DIAGNOSIS — Z510 Encounter for antineoplastic radiation therapy: Secondary | ICD-10-CM | POA: Diagnosis not present

## 2023-12-04 DIAGNOSIS — Z736 Limitation of activities due to disability: Secondary | ICD-10-CM | POA: Diagnosis not present

## 2023-12-05 DIAGNOSIS — Z170 Estrogen receptor positive status [ER+]: Secondary | ICD-10-CM | POA: Diagnosis not present

## 2023-12-05 DIAGNOSIS — C50211 Malignant neoplasm of upper-inner quadrant of right female breast: Secondary | ICD-10-CM | POA: Diagnosis not present

## 2023-12-05 DIAGNOSIS — Z510 Encounter for antineoplastic radiation therapy: Secondary | ICD-10-CM | POA: Diagnosis not present

## 2023-12-06 DIAGNOSIS — Z510 Encounter for antineoplastic radiation therapy: Secondary | ICD-10-CM | POA: Diagnosis not present

## 2023-12-06 DIAGNOSIS — C50211 Malignant neoplasm of upper-inner quadrant of right female breast: Secondary | ICD-10-CM | POA: Diagnosis not present

## 2023-12-06 DIAGNOSIS — Z170 Estrogen receptor positive status [ER+]: Secondary | ICD-10-CM | POA: Diagnosis not present

## 2023-12-09 DIAGNOSIS — Z170 Estrogen receptor positive status [ER+]: Secondary | ICD-10-CM | POA: Diagnosis not present

## 2023-12-09 DIAGNOSIS — I972 Postmastectomy lymphedema syndrome: Secondary | ICD-10-CM | POA: Diagnosis not present

## 2023-12-09 DIAGNOSIS — I890 Lymphedema, not elsewhere classified: Secondary | ICD-10-CM | POA: Diagnosis not present

## 2023-12-09 DIAGNOSIS — Z510 Encounter for antineoplastic radiation therapy: Secondary | ICD-10-CM | POA: Diagnosis not present

## 2023-12-09 DIAGNOSIS — C50211 Malignant neoplasm of upper-inner quadrant of right female breast: Secondary | ICD-10-CM | POA: Diagnosis not present

## 2023-12-10 DIAGNOSIS — C50211 Malignant neoplasm of upper-inner quadrant of right female breast: Secondary | ICD-10-CM | POA: Diagnosis not present

## 2023-12-10 DIAGNOSIS — Z510 Encounter for antineoplastic radiation therapy: Secondary | ICD-10-CM | POA: Diagnosis not present

## 2023-12-10 DIAGNOSIS — Z170 Estrogen receptor positive status [ER+]: Secondary | ICD-10-CM | POA: Diagnosis not present

## 2023-12-11 DIAGNOSIS — Z510 Encounter for antineoplastic radiation therapy: Secondary | ICD-10-CM | POA: Diagnosis not present

## 2023-12-11 DIAGNOSIS — Z170 Estrogen receptor positive status [ER+]: Secondary | ICD-10-CM | POA: Diagnosis not present

## 2023-12-11 DIAGNOSIS — C50211 Malignant neoplasm of upper-inner quadrant of right female breast: Secondary | ICD-10-CM | POA: Diagnosis not present

## 2023-12-12 DIAGNOSIS — Z510 Encounter for antineoplastic radiation therapy: Secondary | ICD-10-CM | POA: Diagnosis not present

## 2023-12-12 DIAGNOSIS — C50211 Malignant neoplasm of upper-inner quadrant of right female breast: Secondary | ICD-10-CM | POA: Diagnosis not present

## 2023-12-12 DIAGNOSIS — Z170 Estrogen receptor positive status [ER+]: Secondary | ICD-10-CM | POA: Diagnosis not present

## 2023-12-13 DIAGNOSIS — Z510 Encounter for antineoplastic radiation therapy: Secondary | ICD-10-CM | POA: Diagnosis not present

## 2023-12-13 DIAGNOSIS — C50211 Malignant neoplasm of upper-inner quadrant of right female breast: Secondary | ICD-10-CM | POA: Diagnosis not present

## 2023-12-13 DIAGNOSIS — Z170 Estrogen receptor positive status [ER+]: Secondary | ICD-10-CM | POA: Diagnosis not present

## 2023-12-14 DIAGNOSIS — C50211 Malignant neoplasm of upper-inner quadrant of right female breast: Secondary | ICD-10-CM | POA: Diagnosis not present

## 2023-12-14 DIAGNOSIS — Z510 Encounter for antineoplastic radiation therapy: Secondary | ICD-10-CM | POA: Diagnosis not present

## 2023-12-14 DIAGNOSIS — Z170 Estrogen receptor positive status [ER+]: Secondary | ICD-10-CM | POA: Diagnosis not present

## 2023-12-16 DIAGNOSIS — C50211 Malignant neoplasm of upper-inner quadrant of right female breast: Secondary | ICD-10-CM | POA: Diagnosis not present

## 2023-12-16 DIAGNOSIS — Z510 Encounter for antineoplastic radiation therapy: Secondary | ICD-10-CM | POA: Diagnosis not present

## 2023-12-16 DIAGNOSIS — Z170 Estrogen receptor positive status [ER+]: Secondary | ICD-10-CM | POA: Diagnosis not present

## 2023-12-17 DIAGNOSIS — C50211 Malignant neoplasm of upper-inner quadrant of right female breast: Secondary | ICD-10-CM | POA: Diagnosis not present

## 2023-12-17 DIAGNOSIS — Z170 Estrogen receptor positive status [ER+]: Secondary | ICD-10-CM | POA: Diagnosis not present

## 2023-12-17 DIAGNOSIS — Z510 Encounter for antineoplastic radiation therapy: Secondary | ICD-10-CM | POA: Diagnosis not present

## 2023-12-18 DIAGNOSIS — Z510 Encounter for antineoplastic radiation therapy: Secondary | ICD-10-CM | POA: Diagnosis not present

## 2023-12-18 DIAGNOSIS — Z170 Estrogen receptor positive status [ER+]: Secondary | ICD-10-CM | POA: Diagnosis not present

## 2023-12-18 DIAGNOSIS — C50211 Malignant neoplasm of upper-inner quadrant of right female breast: Secondary | ICD-10-CM | POA: Diagnosis not present

## 2023-12-19 DIAGNOSIS — C50211 Malignant neoplasm of upper-inner quadrant of right female breast: Secondary | ICD-10-CM | POA: Diagnosis not present

## 2023-12-19 DIAGNOSIS — Z510 Encounter for antineoplastic radiation therapy: Secondary | ICD-10-CM | POA: Diagnosis not present

## 2023-12-19 DIAGNOSIS — Z170 Estrogen receptor positive status [ER+]: Secondary | ICD-10-CM | POA: Diagnosis not present

## 2023-12-20 DIAGNOSIS — Z170 Estrogen receptor positive status [ER+]: Secondary | ICD-10-CM | POA: Diagnosis not present

## 2023-12-20 DIAGNOSIS — C50211 Malignant neoplasm of upper-inner quadrant of right female breast: Secondary | ICD-10-CM | POA: Diagnosis not present

## 2023-12-20 DIAGNOSIS — Z510 Encounter for antineoplastic radiation therapy: Secondary | ICD-10-CM | POA: Diagnosis not present

## 2023-12-24 DIAGNOSIS — Z736 Limitation of activities due to disability: Secondary | ICD-10-CM | POA: Diagnosis not present

## 2023-12-24 DIAGNOSIS — Z170 Estrogen receptor positive status [ER+]: Secondary | ICD-10-CM | POA: Diagnosis not present

## 2023-12-24 DIAGNOSIS — C50211 Malignant neoplasm of upper-inner quadrant of right female breast: Secondary | ICD-10-CM | POA: Diagnosis not present

## 2023-12-24 DIAGNOSIS — Z510 Encounter for antineoplastic radiation therapy: Secondary | ICD-10-CM | POA: Diagnosis not present

## 2023-12-24 DIAGNOSIS — I890 Lymphedema, not elsewhere classified: Secondary | ICD-10-CM | POA: Diagnosis not present

## 2023-12-25 DIAGNOSIS — Z170 Estrogen receptor positive status [ER+]: Secondary | ICD-10-CM | POA: Diagnosis not present

## 2023-12-25 DIAGNOSIS — Z79811 Long term (current) use of aromatase inhibitors: Secondary | ICD-10-CM | POA: Diagnosis not present

## 2023-12-25 DIAGNOSIS — C50211 Malignant neoplasm of upper-inner quadrant of right female breast: Secondary | ICD-10-CM | POA: Diagnosis not present

## 2024-01-16 DIAGNOSIS — D84821 Immunodeficiency due to drugs: Secondary | ICD-10-CM | POA: Diagnosis not present

## 2024-01-16 DIAGNOSIS — Z6841 Body Mass Index (BMI) 40.0 and over, adult: Secondary | ICD-10-CM | POA: Diagnosis not present

## 2024-01-16 DIAGNOSIS — L4050 Arthropathic psoriasis, unspecified: Secondary | ICD-10-CM | POA: Diagnosis not present
